# Patient Record
Sex: Male | Born: 1960 | Race: White | Hispanic: Refuse to answer | Smoking: Never smoker
Health system: Southern US, Community
[De-identification: ages and names within clinical notes are randomized; demographics above are authoritative.]

## PROBLEM LIST (undated history)

## (undated) DIAGNOSIS — I10 Essential (primary) hypertension: Secondary | ICD-10-CM

## (undated) DIAGNOSIS — E785 Hyperlipidemia, unspecified: Secondary | ICD-10-CM

---

## 2010-09-28 ENCOUNTER — Emergency Department (HOSPITAL_BASED_OUTPATIENT_CLINIC_OR_DEPARTMENT_OTHER): Admission: EM | Admit: 2010-09-28 | Discharge: 2010-09-28 | Payer: Self-pay | Admitting: Emergency Medicine

## 2010-09-28 ENCOUNTER — Ambulatory Visit: Payer: Self-pay | Admitting: Diagnostic Radiology

## 2011-01-28 LAB — BASIC METABOLIC PANEL
BUN: 19 mg/dL (ref 6–23)
Calcium: 9.7 mg/dL (ref 8.4–10.5)
Chloride: 106 mEq/L (ref 96–112)
Creatinine, Ser: 0.9 mg/dL (ref 0.4–1.5)
GFR calc Af Amer: >60 mL/min (ref >60)

## 2011-01-28 LAB — URINE CULTURE
Colony Count: NO GROWTH
Culture  Setup Time: 201111122149
Culture: NO GROWTH

## 2011-01-28 LAB — URINALYSIS, ROUTINE W REFLEX MICROSCOPIC
Ketones, ur: NEGATIVE mg/dL
Leukocytes, UA: NEGATIVE
Nitrite: NEGATIVE
Protein, ur: NEGATIVE mg/dL
Urobilinogen, UA: 0.2 mg/dL (ref 0.0–1.0)

## 2011-01-28 LAB — URINE MICROSCOPIC-ADD ON

## 2011-10-24 IMAGING — CT CT ABD-PELV W/O CM
2 of 4 series · 16 of 46 positions shown, 18 images · non-contrast
Comparison: None.

CLINICAL DATA: Right flank pain radiates to the right abdomen.

CT ABDOMEN AND PELVIS WITHOUT CONTRAST
TECHNIQUE: Multidetector CT imaging of the abdomen and pelvis was
performed following the standard protocol without intravenous
contrast.

[Series 2: renal stone > 200 lbs 5.0 b31f · axial · 0.83mm/px · z∈[+739,+1204]mm · 13 of 103 slices shown, 15 images]
[im 5/103  soft-tissue]
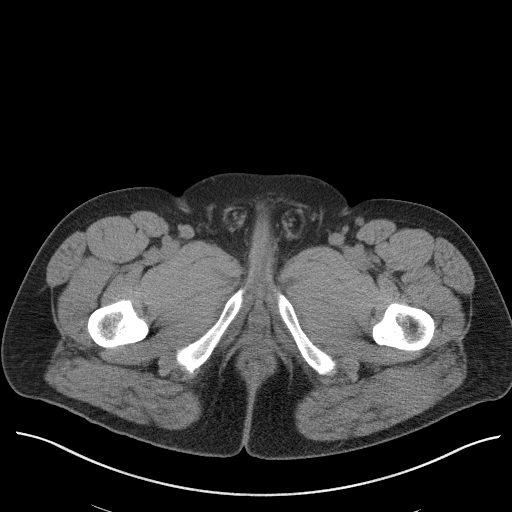
[im 5/103  bone]
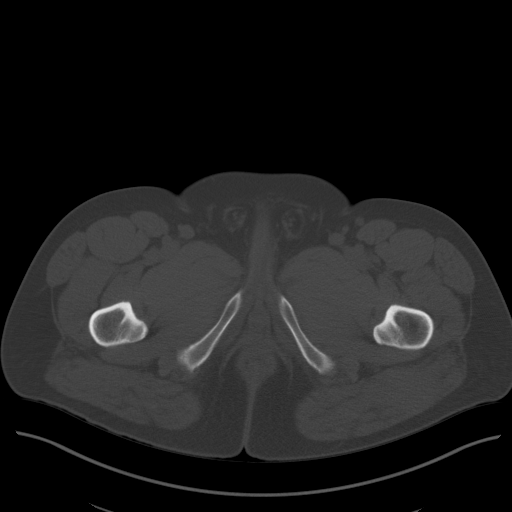
[im 13/103  soft-tissue]
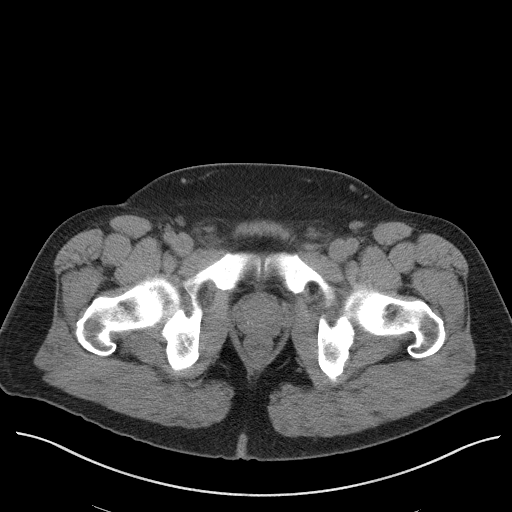
[im 22/103  soft-tissue]
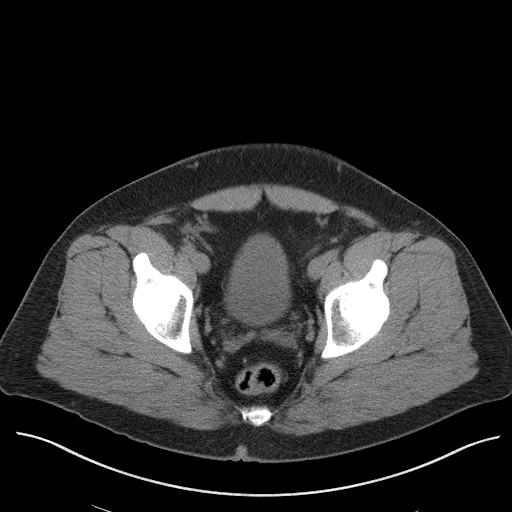
[im 30/103  soft-tissue]
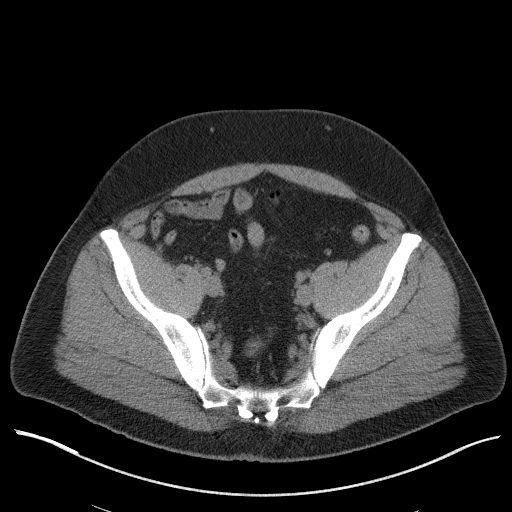
[im 35/103  soft-tissue]
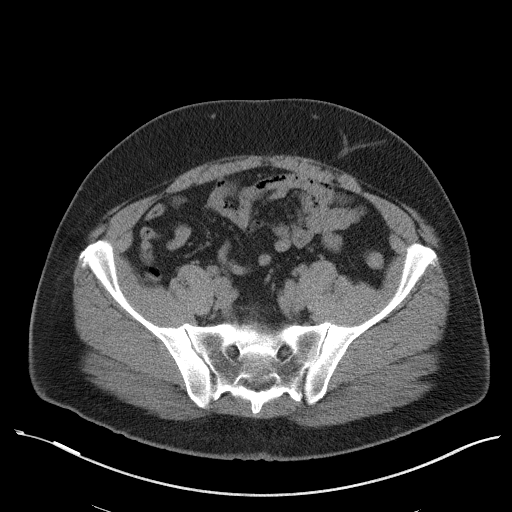
[im 43/103  soft-tissue]
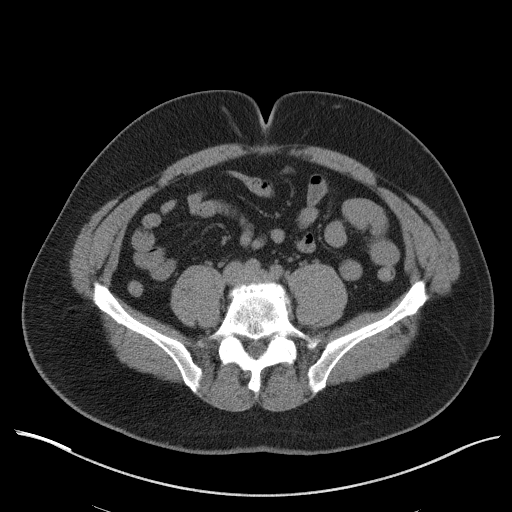
[im 52/103  soft-tissue]
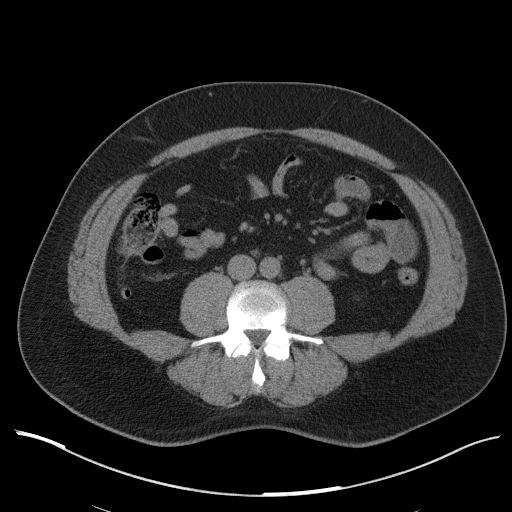
[im 60/103  soft-tissue]
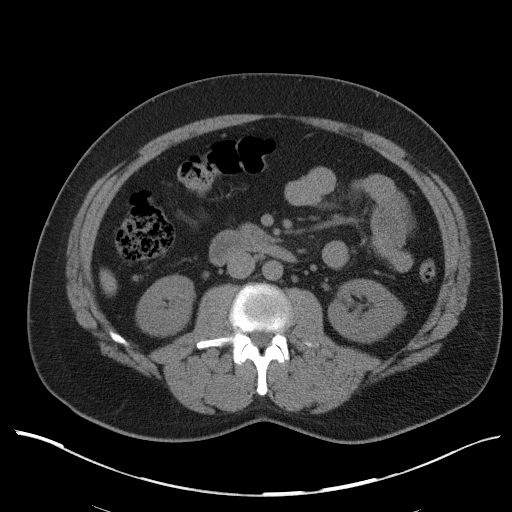
[im 69/103  soft-tissue]
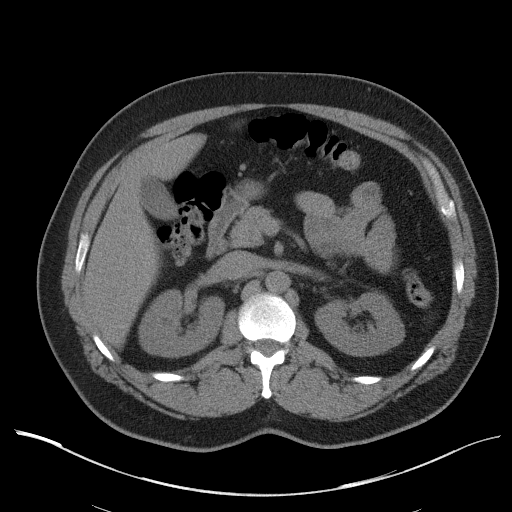
[im 69/103  bone]
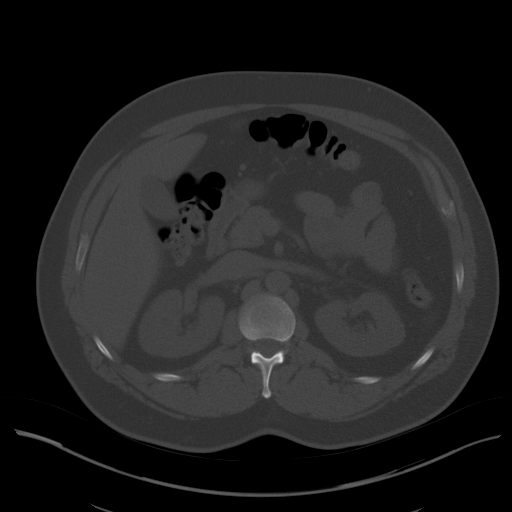
[im 73/103  soft-tissue]
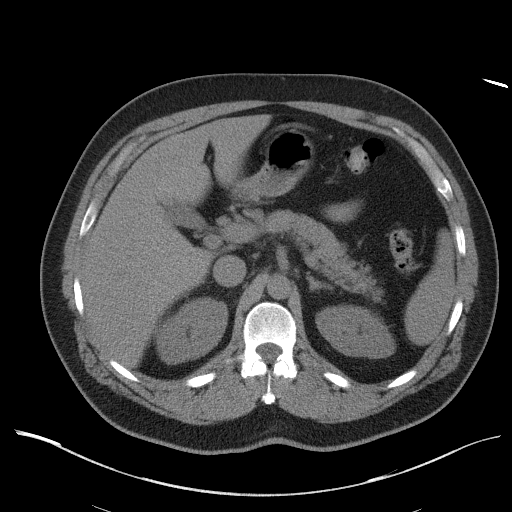
[im 81/103  soft-tissue]
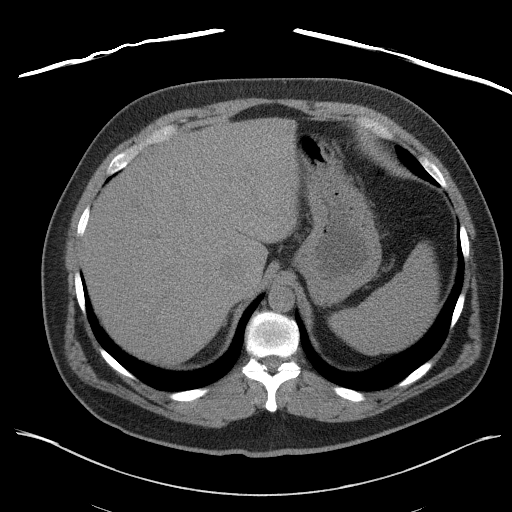
[im 90/103  soft-tissue]
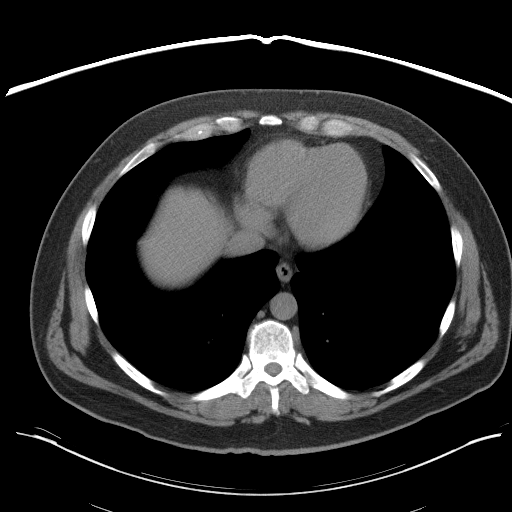
[im 98/103  soft-tissue]
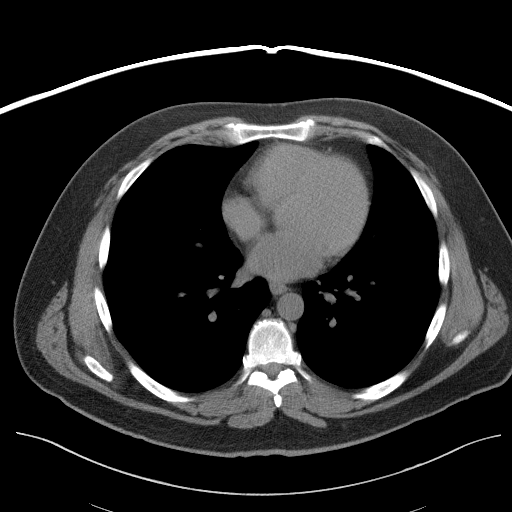

[Series 5: renal stone 3.0 coronal · coronal · 0.96mm/px · 3 of 88 slices shown]
[im 30/88  soft-tissue]
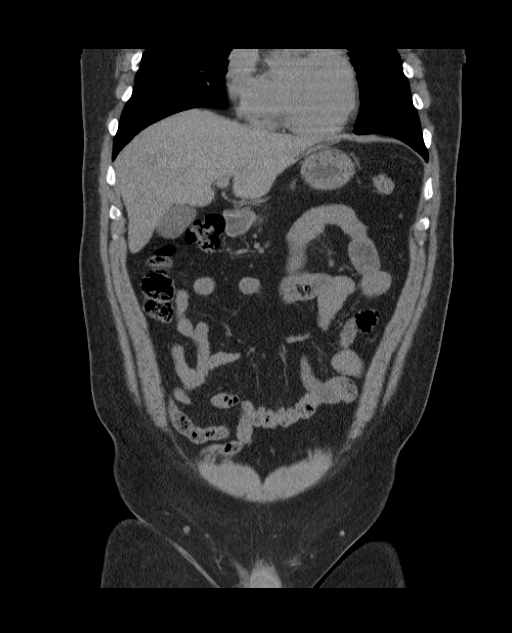
[im 39/88  soft-tissue]
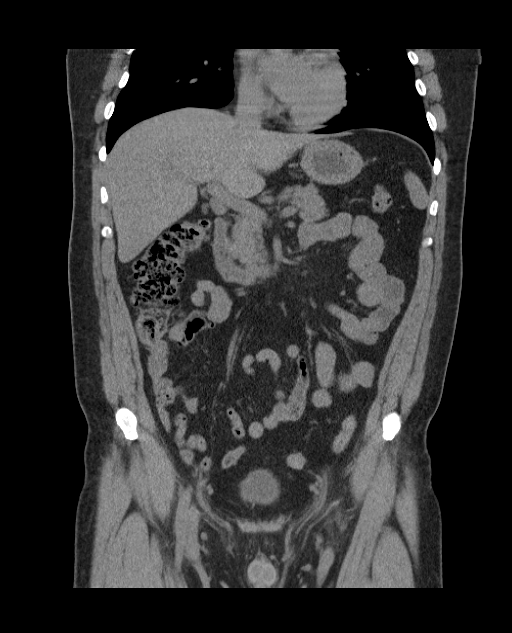
[im 49/88  soft-tissue]
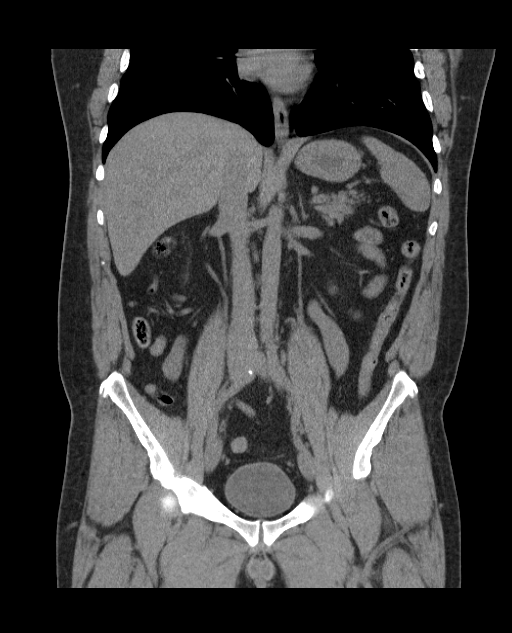

[16 of 46 positions shown; findings below may reference images not displayed]

FINDINGS: The liver and spleen have normal uninfused features.  The
stomach, duodenum, pancreas, gallbladder, and adrenal glands have
normal imaging features.

The kidneys are normal bilaterally without evidence for renal
stones, hydronephrosis, or renal mass.

No free fluid in the abdomen.  There are some borderline enlarged
lymph nodes in the hepatoduodenal ligament but no overt
lymphadenopathy is identified.

Imaging through the pelvis shows no intraperitoneal free fluid.
There is no pelvic lymphadenopathy.  Bladder is unremarkable.
Prostate gland shows mild enlargement.  No evidence for ureteral or
bladder calculi.  No colonic diverticulitis.  The terminal ileum is
normal.  The appendix is normal.

Bone windows reveal no worrisome lytic or sclerotic osseous
lesions.
IMPRESSION: No acute findings in the abdomen or pelvis.  The patient does have
some borderline hepatoduodenal ligament lymphadenopathy of
indeterminate significance/etiology.  This may be a chronic
finding, but additional follow-up CT scan in 3 months is
recommended to ensure that there is no progression.

## 2022-09-23 ENCOUNTER — Other Ambulatory Visit: Payer: Self-pay

## 2022-09-23 ENCOUNTER — Emergency Department (HOSPITAL_BASED_OUTPATIENT_CLINIC_OR_DEPARTMENT_OTHER): Payer: Self-pay

## 2022-09-23 ENCOUNTER — Encounter (HOSPITAL_BASED_OUTPATIENT_CLINIC_OR_DEPARTMENT_OTHER): Payer: Self-pay | Admitting: Pediatrics

## 2022-09-23 ENCOUNTER — Emergency Department (HOSPITAL_BASED_OUTPATIENT_CLINIC_OR_DEPARTMENT_OTHER)
Admission: EM | Admit: 2022-09-23 | Discharge: 2022-09-23 | Disposition: A | Discharge status: Home / Self Care | Payer: No Typology Code available for payment source | Attending: Emergency Medicine | Admitting: Emergency Medicine

## 2022-09-23 DIAGNOSIS — S61213A Laceration without foreign body of left middle finger without damage to nail, initial encounter: Secondary | ICD-10-CM | POA: Insufficient documentation

## 2022-09-23 DIAGNOSIS — W268XXA Contact with other sharp object(s), not elsewhere classified, initial encounter: Secondary | ICD-10-CM | POA: Insufficient documentation

## 2022-09-23 DIAGNOSIS — Z23 Encounter for immunization: Secondary | ICD-10-CM | POA: Insufficient documentation

## 2022-09-23 DIAGNOSIS — S6992XA Unspecified injury of left wrist, hand and finger(s), initial encounter: Secondary | ICD-10-CM | POA: Diagnosis present

## 2022-09-23 DIAGNOSIS — Y990 Civilian activity done for income or pay: Secondary | ICD-10-CM | POA: Diagnosis not present

## 2022-09-23 HISTORY — DX: Essential (primary) hypertension: I10

## 2022-09-23 HISTORY — DX: Hyperlipidemia, unspecified: E78.5

## 2022-09-23 MED ORDER — TETANUS-DIPHTH-ACELL PERTUSSIS 5-2.5-18.5 LF-MCG/0.5 IM SUSY
0.5000 mL | PREFILLED_SYRINGE | Freq: Once | INTRAMUSCULAR | Status: AC
  Administered 2022-09-23: 0.5 mL via INTRAMUSCULAR
  Filled 2022-09-23: qty 0.5

## 2022-09-23 MED ORDER — LIDOCAINE HCL (PF) 1 % IJ SOLN
10.0000 mL | Freq: Once | INTRAMUSCULAR | Status: AC
  Administered 2022-09-23: 10 mL
  Filled 2022-09-23: qty 10

## 2022-09-23 NOTE — Discharge Instructions (Addendum)
It was a pleasure taking care of you today!   You may return to urgent care or return to the emergency department for suture removal in 7-10 days.  Keep the area clean and dry. Attached is more information regarding laceration care. Return to the emergency department if worsening or persistent pain, drainage of wound, increased swelling, or color change to area.

## 2022-09-23 NOTE — ED Triage Notes (Signed)
Reported injured left middle finger on a metal plate while at work; bandage in place; bleeding controlled.

## 2022-09-23 NOTE — ED Provider Notes (Signed)
[facility] EMERGENCY DEPARTMENT Provider Note   CSN: [id number] Arrival date & time: 09/23/22  1143     History  Chief Complaint  Patient presents with   Finger Injury    Jordan Frazier is a 61 y.o. male who presents emergency department with concerns for left middle finger injury onset prior to arrival.  He notes that his finger was cut by metal plate while at work.  No anticoagulant use at this time.  His tetanus has not been within the past 5 years.  Denies swelling.  No known drug allergies.  The history is provided by the patient. No language interpreter was used.       Home Medications Prior to Admission medications   Not on File      Allergies    Patient has no known allergies.    Review of Systems   Review of Systems  All other systems reviewed and are negative.   Physical Exam Updated Vital Signs BP (!) 166/97 (BP Location: Right Arm)   Pulse 74   Temp 98 F (36.7 C) (Oral)   Resp 18   Ht 5' 10.5" (1.791 m)   Wt 103.4 kg   SpO2 94%   BMI 32.25 kg/m  Physical Exam Vitals and nursing note reviewed.  Constitutional:      General: He is not in acute distress.    Appearance: Normal appearance. He is not ill-appearing.  HENT:     Head: Normocephalic and atraumatic.     Right Ear: External ear normal.     Left Ear: External ear normal.  Eyes:     General: No scleral icterus. Cardiovascular:     Rate and Rhythm: Normal rate.  Pulmonary:     Effort: Pulmonary effort is normal.  Musculoskeletal:        General: Normal range of motion.     Cervical back: Normal range of motion and neck supple.     Comments: Normal flexion and extension against resistance. NVI. Capillary refill intact. Nailbed intact.   Skin:    General: Skin is warm and dry.     Capillary Refill: Capillary refill takes less than 2 seconds.     Findings: Laceration present.     Comments: 2 cm laceration noted to distal phalanx of left middle finger.  Neurological:     Mental  Status: He is alert.     ED Results / Procedures / Treatments   Labs (all labs ordered are listed, but only abnormal results are displayed) Labs Reviewed - No data to display  EKG None  Radiology DG Hand Complete Left  Result Date: 09/23/2022 CLINICAL DATA:  Trauma EXAM: LEFT HAND - COMPLETE 3 VIEW COMPARISON:  None Available. FINDINGS: No evidence of fracture or dislocation. Significant mild degenerative changes of the thumb joint IP joint in the distal radioulnar joint. Soft tissue swelling and irregularity of the distal middle phalanx with overlying bandage. IMPRESSION: No acute fracture or dislocation. Electronically Signed   By: Allegra Lai M.D.   On: 09/23/2022 12:40    Procedures .Marland KitchenLaceration Repair  Date/Time: 09/23/2022 5:34 PM  Performed by: Karenann Cai, PA-C Authorized by: Karenann Cai, PA-C   Consent:    Consent obtained:  Verbal   Consent given by:  Patient   Risks discussed:  Infection, pain and need for additional repair Universal protocol:    Patient identity confirmed:  Verbally with patient and hospital-assigned identification number Anesthesia:    Anesthesia method:  Local infiltration  Local anesthetic:  Lidocaine 1% w/o epi Laceration details:    Location:  Finger   Finger location:  L long finger   Length (cm):  2.5 Pre-procedure details:    Preparation:  Patient was prepped and draped in usual sterile fashion Exploration:    Hemostasis achieved with:  Direct pressure   Imaging outcome: foreign body not noted     Wound exploration: entire depth of wound visualized   Treatment:    Area cleansed with:  Saline   Amount of cleaning:  Standard   Irrigation solution:  Sterile saline   Irrigation method:  Syringe Skin repair:    Repair method:  Sutures   Suture size:  5-0   Suture material:  Prolene   Suture technique:  Simple interrupted   Number of sutures:  8 Approximation:    Approximation:  Close Repair type:    Repair type:   Simple Post-procedure details:    Dressing:  Non-adherent dressing   Procedure completion:  Tolerated well, no immediate complications     Medications Ordered in ED Medications  lidocaine (PF) (XYLOCAINE) 1 % injection 10 mL (10 mLs Infiltration Given by Other 09/23/22 1539)  Tdap (BOOSTRIX) injection 0.5 mL (0.5 mLs Intramuscular Given 09/23/22 1541)    ED Course/ Medical Decision Making/ A&P                           Medical Decision Making Amount and/or Complexity of Data Reviewed Radiology: ordered.  Risk Prescription drug management.   Patient presents with laceration noted to left middle finger onset prior to arrival. Pt is not on anticoagulants at this time. Vital signs, patient afebrile. On exam, patient with Normal flexion and extension against resistance. NVI. Capillary refill intact. Nailbed intact. 2 cm laceration noted to distal phalanx of left middle finger. Tetanus updated in the ED. Laceration occurred < 12 hours prior to repair. Differential diagnosis includes, fracture, foreign body, dislocation, avulsion.   Imaging: I ordered imaging studies including left hand x-ray I independently visualized and interpreted imaging which showed: No acute fracture or dislocation I agree with the radiologist interpretation    Disposition: Presenting suspicious for laceration. Doubt fracture, dislocation, or foreign body at this time. Tetanus updated in the ED. Wound thoroughly irrigated, no foreign bodies noted. Laceration repaired in the ED today. After consideration of the diagnostic results and the patients response to treatment, I feel that the patient would benefit from Discharge home. Discussed laceration care with pt and answered questions. Pt to follow up for suture/staple removal in 7-10 days and wound check sooner should there be signs of dehiscence or infection. Pt is hemodynamically stable with no complaints prior to discharge. Supportive care measures and strict return  precautions discussed with patient at bedside. Pt acknowledges and verbalizes understanding. Pt appears safe for discharge. Follow up as indicated in discharge paperwork.    This chart was dictated using voice recognition software, Dragon. Despite the best efforts of this provider to proofread and correct errors, errors may still occur which can change documentation meaning.   Final Clinical Impression(s) / ED Diagnoses Final diagnoses:  Laceration of left middle finger without foreign body without damage to nail, initial encounter    Rx / DC Orders ED Discharge Orders     None         Latania Bascomb A, PA-C 09/23/22 [place], Dan, DO 09/23/22 1810

## 2022-09-23 NOTE — ED Notes (Signed)
Sterile dressing applied

## 2022-10-07 ENCOUNTER — Emergency Department (HOSPITAL_BASED_OUTPATIENT_CLINIC_OR_DEPARTMENT_OTHER)
Admission: EM | Admit: 2022-10-07 | Discharge: 2022-10-07 | Disposition: A | Discharge status: Home / Self Care | Payer: No Typology Code available for payment source | Attending: Emergency Medicine | Admitting: Emergency Medicine

## 2022-10-07 ENCOUNTER — Encounter (HOSPITAL_BASED_OUTPATIENT_CLINIC_OR_DEPARTMENT_OTHER): Payer: Self-pay

## 2022-10-07 DIAGNOSIS — Z4801 Encounter for change or removal of surgical wound dressing: Secondary | ICD-10-CM | POA: Diagnosis present

## 2022-10-07 DIAGNOSIS — Z4802 Encounter for removal of sutures: Secondary | ICD-10-CM

## 2022-10-07 NOTE — Discharge Instructions (Signed)
You were seen in the emergency department for your suture removal.  All stitches were removed from your finger and your wound appears to be healing well.  There are no signs of infection today.  You can follow-up with your primary doctor as needed.  You should return to the emergency department if you are having increasing pain and swelling around your finger, spreading redness, pus draining from the wound or if you have any other new or concerning symptoms.

## 2022-10-07 NOTE — ED Triage Notes (Signed)
Here for suture removal, 8 sutures to left middle finger, placed on 11/7.

## 2022-10-07 NOTE — ED Provider Notes (Signed)
[facility] Provider Note   CSN: [id number] Arrival date & time: 10/07/22  [id number]     History  Chief Complaint  Patient presents with   Suture / Staple Removal    Jordan Frazier is a 61 y.o. male.  Is a 61 year old male presenting to the emergency department for suture removal.  Patient states that he cut his finger 2 weeks ago and was seen in the emergency department here and had 8 stitches placed.  He states that he was told to have the stitches removed in 7 to 10 days and was seen at an urgent care in [facility] over the weekend but they recommended he wait 3 more days before suture removal.  He states that he is having a little bit of tenderness around the tip of his finger but denies any redness or drainage, fevers or chills.  He states he took Advil for pain this morning.  The history is provided by the patient.  Suture / Staple Removal       Home Medications Prior to Admission medications   Not on File      Allergies    Niacin    Review of Systems   Review of Systems  Physical Exam Updated Vital Signs BP (!) 172/91   Pulse 62   Temp 97.7 F (36.5 C) (Oral)   Resp 18   Ht 5' 10.5" (1.791 m)   Wt 104.3 kg   SpO2 99%   BMI 32.54 kg/m  Physical Exam Vitals and nursing note reviewed.  Constitutional:      General: He is not in acute distress.    Appearance: Normal appearance. He is obese.  HENT:     Head: Normocephalic and atraumatic.     Nose: Nose normal.     Mouth/Throat:     Mouth: Mucous membranes are moist.     Pharynx: Oropharynx is clear.  Eyes:     Extraocular Movements: Extraocular movements intact.  Cardiovascular:     Pulses: Normal pulses.  Pulmonary:     Effort: Pulmonary effort is normal.  Musculoskeletal:     Cervical back: Normal range of motion.     Comments: Full flexion/extension of left middle finger DIP, PIP and MCP joints, mild tenderness to palpation of distal tip of left middle finger  Skin:     General: Skin is warm and dry.     Comments: Well-healing laceration to tip of left middle finger with sutures in place, no surrounding erythema, warmth or drainage  Neurological:     General: No focal deficit present.     Mental Status: He is alert and oriented to person, place, and time.     Sensory: No sensory deficit.     Motor: No weakness.  Psychiatric:        Mood and Affect: Mood normal.        Behavior: Behavior normal.     ED Results / Procedures / Treatments   Labs (all labs ordered are listed, but only abnormal results are displayed) Labs Reviewed - No data to display  EKG None  Radiology No results found.  Procedures .Suture Removal  Date/Time: 10/07/2022 7:51 AM  Performed by: Kemper Durie, DO Authorized by: Kemper Durie, DO   Consent:    Consent obtained:  Verbal   Consent given by:  Patient   Risks, benefits, and alternatives were discussed: yes     Risks discussed:  Bleeding, pain and wound separation  Alternatives discussed:  No treatment and delayed treatment Universal protocol:    Procedure explained and questions answered to patient or proxy's satisfaction: yes     Patient identity confirmed:  Verbally with patient Location:    Location:  Upper extremity   Upper extremity location:  Hand   Hand location:  L long finger Procedure details:    Wound appearance:  No signs of infection, tender, good wound healing and clean   Number of sutures removed:  8 Post-procedure details:    Post-removal:  No dressing applied   Procedure completion:  Tolerated well, no immediate complications     Medications Ordered in ED Medications - No data to display  ED Course/ Medical Decision Making/ A&P                           Medical Decision Making This patient presents to the ED with chief complaint(s) of suture removal with pertinent past medical history of pretension, hyperlipidemia which further complicates the presenting complaint.  The complaint involves an extensive differential diagnosis and also carries with it a high risk of complications and morbidity.    The differential diagnosis includes well-healed wound, no signs of infection  Additional history obtained: Additional history obtained from N/A Records reviewed previous ED records  ED Course and Reassessment: Upon patient's arrival to the emergency department he was awake alert and well-appearing.  He has mild tenderness around the tip of his left middle finger but no erythema, warmth or drainage making infection unlikely.  All sutures were removed without any wound dehiscence.  Patient stable for discharge home with follow-up as needed.  Independent labs interpretation:  N/A  Independent visualization of imaging: N/A  Consultation: - Consulted or discussed management/test interpretation w/ external professional: N/A  Consideration for admission or further workup: Patient has no emergent conditions requiring admission or further work-up at this time and is stable for discharge home with primary care follow-up as needed Social Determinants of health: N/A            Final Clinical Impression(s) / ED Diagnoses Final diagnoses:  Visit for suture removal    Rx / DC Orders ED Discharge Orders     None         Rexford Maus, DO 10/07/22 [phone]

## 2022-12-13 ENCOUNTER — Emergency Department (HOSPITAL_BASED_OUTPATIENT_CLINIC_OR_DEPARTMENT_OTHER): Payer: BC Managed Care – PPO

## 2022-12-13 ENCOUNTER — Encounter (HOSPITAL_BASED_OUTPATIENT_CLINIC_OR_DEPARTMENT_OTHER): Payer: Self-pay | Admitting: Emergency Medicine

## 2022-12-13 ENCOUNTER — Emergency Department (HOSPITAL_BASED_OUTPATIENT_CLINIC_OR_DEPARTMENT_OTHER)
Admission: EM | Admit: 2022-12-13 | Discharge: 2022-12-13 | Disposition: A | Discharge status: Home / Self Care | Payer: BC Managed Care – PPO | Attending: Emergency Medicine | Admitting: Emergency Medicine

## 2022-12-13 ENCOUNTER — Other Ambulatory Visit: Payer: Self-pay

## 2022-12-13 DIAGNOSIS — S6992XA Unspecified injury of left wrist, hand and finger(s), initial encounter: Secondary | ICD-10-CM | POA: Diagnosis present

## 2022-12-13 DIAGNOSIS — S63637A Sprain of interphalangeal joint of left little finger, initial encounter: Secondary | ICD-10-CM | POA: Insufficient documentation

## 2022-12-13 DIAGNOSIS — M79645 Pain in left finger(s): Secondary | ICD-10-CM | POA: Insufficient documentation

## 2022-12-13 DIAGNOSIS — W108XXA Fall (on) (from) other stairs and steps, initial encounter: Secondary | ICD-10-CM | POA: Diagnosis not present

## 2022-12-13 DIAGNOSIS — S6991XA Unspecified injury of right wrist, hand and finger(s), initial encounter: Secondary | ICD-10-CM

## 2022-12-13 NOTE — Discharge Instructions (Addendum)
You have been seen today for your complaint of right thumb and left fifth finger pain. Your imaging showed that she may have an extensor tendon injury. Your discharge medications include Alternate tylenol and ibuprofen for pain. You may alternate these every 4 hours. You may take up to 800 mg of ibuprofen at a time and up to 1000 mg of tylenol. Home care instructions are as follows:  Keep the splint on at all times until you are able to follow-up with hand surgery Follow up with: Dr. Fredna Dow.  He is a Copy.  He should call the number listed in this packet to schedule an appointment for as soon as possible. Please seek immediate medical care if you develop any of the following symptoms: Your finger is numb or blue. Your finger feels colder to the touch than normal. At this time there does not appear to be the presence of an emergent medical condition, however there is always the potential for conditions to change. Please read and follow the below instructions.  Do not take your medicine if  develop an itchy rash, swelling in your mouth or lips, or difficulty breathing; call 911 and seek immediate emergency medical attention if this occurs.  You may review your lab tests and imaging results in their entirety on your MyChart account.  Please discuss all results of fully with your primary care provider and other specialist at your follow-up visit.  Note: Portions of this text may have been transcribed using voice recognition software. Every effort was made to ensure accuracy; however, inadvertent computerized transcription errors may still be present.

## 2022-12-13 NOTE — ED Provider Notes (Signed)
[facility] Provider Note   CSN: [id number] Arrival date & time: 12/13/22  0841     History  Chief Complaint  Patient presents with   Finger Injury    Jordan Frazier is a 62 y.o. male.  Who presents to the ED for evaluation of a fall.  He states that yesterday afternoon he was walking up his stairs at home carrying a case of bottles of water.  He tripped over one of the stairs and fell up the stairs.  He tried to catch himself with outstretched hands.  Does not remember the exact details of the fall because it happened too fast.  He did not hit his head or lose consciousness.  Does not take blood thinners.  Is complaining of right thumb pain with flexion and left fifth finger pain with flexion.  He has no pain at rest.  He does not have any wrist pain.  He states he was able to sleep through the night without difficulty.  He has not iced or taking any pain medications at home.  He has been unable to fully extend the right thumb since the injury.  HPI     Home Medications Prior to Admission medications   Not on File      Allergies    Niacin    Review of Systems   Review of Systems  Musculoskeletal:  Positive for arthralgias.  All other systems reviewed and are negative.   Physical Exam Updated Vital Signs BP (!) 127/52 (BP Location: Right Arm)   Pulse 68   Temp 98.2 F (36.8 C)   Resp 17   SpO2 98%  Physical Exam Vitals and nursing note reviewed.  Constitutional:      General: He is not in acute distress.    Appearance: Normal appearance. He is well-developed. He is not ill-appearing, toxic-appearing or diaphoretic.  HENT:     Head: Normocephalic and atraumatic.  Eyes:     Conjunctiva/sclera: Conjunctivae normal.  Cardiovascular:     Rate and Rhythm: Normal rate and regular rhythm.     Heart sounds: No murmur heard. Pulmonary:     Effort: Pulmonary effort is normal. No respiratory distress.     Breath sounds: Normal  breath sounds.  Abdominal:     Palpations: Abdomen is soft.     Tenderness: There is no abdominal tenderness.  Musculoskeletal:        General: Swelling (R thumb swelling at DIP, L 5th finger swelling at DIP) and deformity (R thumb held in slight flexion) present. No tenderness (No TTP. specifically no TTP to bilateral wrists, R thumb, bilateral anatomical snuffbox, L 5th finger).     Cervical back: Neck supple.     Comments: Unable to extend R thumb at DIP joint  Skin:    General: Skin is warm and dry.     Capillary Refill: Capillary refill takes less than 2 seconds.  Neurological:     General: No focal deficit present.     Mental Status: He is alert and oriented to person, place, and time.     Comments: Sensation intact.  Flexion of the MCP and DIP of the right thumb intact.  Flexion and extension of all other fingers intact.  Grip strength 5 out of 5 in bilateral hands.  Cap refill normal.  Psychiatric:        Mood and Affect: Mood normal.     ED Results / Procedures / Treatments   Labs (  all labs ordered are listed, but only abnormal results are displayed) Labs Reviewed - No data to display  EKG None  Radiology DG Finger Little Left  Result Date: 12/13/2022 CLINICAL DATA:  Fall, left small finger pain EXAM: LEFT LITTLE FINGER 2+V COMPARISON:  09/23/2022 FINDINGS: Tiny mineralized density along the distal aspect of the small finger middle phalanx adjacent to the DIP joint, possibly a tiny avulsion fragment. This is located along the ulnar aspect of the joint. A similar tiny density seen along the radial aspect of the joint, however this was present on the previous radiographs. Mild osteoarthritic changes. No additional signs of fracture. No dislocation. Soft tissues within normal limits. IMPRESSION: Tiny mineralized density along the distal aspect of the small finger middle phalanx adjacent to the DIP joint, possibly a tiny avulsion fragment. Correlate with point tenderness.  Electronically Signed   By: Duanne Guess D.O.   On: 12/13/2022 09:21   DG Finger Thumb Right  Result Date: 12/13/2022 CLINICAL DATA:  Larey Seat on outstretched hand. Right thumb injury and pain. EXAM: RIGHT THUMB 2+V COMPARISON:  None Available. FINDINGS: There is no evidence of fracture or dislocation. There is no evidence of arthropathy or other focal bone abnormality. Partial flexion noted at the interphalangeal joint, which could indicate injury to extensor tendon mechanism. IMPRESSION: No evidence of fracture. Partial flexion at the interphalangeal joint, which could indicate injury to extensor tendon mechanism. Recommend clinical correlation. Electronically Signed   By: Danae Orleans M.D.   On: 12/13/2022 09:12    Procedures Procedures    Medications Ordered in ED Medications - No data to display  ED Course/ Medical Decision Making/ A&P                             Medical Decision Making Amount and/or Complexity of Data Reviewed Radiology: ordered.  This patient presents to the ED for concern of fall, right thumb, left fifth finger pain, this involves an extensive number of treatment options, and is a complaint that carries with it a high risk of complications and morbidity.  The differential diagnosis includes fracture, dislocation, strain, sprain, tendon injury  My initial workup includes x-ray right thumb and left fifth finger  Additional history obtained from: Nursing notes from this visit.  I ordered imaging studies including x-ray right thumb and left ring finger I independently visualized and interpreted imaging which showed right thumb in slight flexion indicating possible extensor tendon injury, left fifth finger with tiny mineralized densities suspicious for possible avulsion fracture I agree with the radiologist interpretation  Afebrile, hemodynamically stable.  62 year old male presents ED for evaluation of right thumb and left fifth finger pain after a fall that  occurred yesterday afternoon.  He only has pain with flexion of either digits.  He has no pain at rest.  He has no tenderness to palpation.  He has some mild swelling to the DIP of the right thumb and the left fifth digit.  He has no wrist pain or anatomical snuffbox tenderness bilaterally.  X-ray shows no definitive fracture.  He is unable to fully extend his right thumb.  Concern for extensor tendon injury. Neurovascular status is intact. Patient will be splinted in extension in the right thumb and the left fifth finger and encouraged to follow-up with hand surgery.  He was given contact information for hand surgery and encouraged to call to schedule an appointment for soon as possible.  He was encouraged to  keep the splint on at all times until he is able to follow-up.  He was given return precautions.  Stable at discharge.  At this time there does not appear to be any evidence of an acute emergency medical condition and the patient appears stable for discharge with appropriate outpatient follow up. Diagnosis was discussed with patient who verbalizes understanding of care plan and is agreeable to discharge. I have discussed return precautions with patient who verbalizes understanding. Patient encouraged to follow-up with hand surgery as soon as possible. All questions answered.  Patient's case discussed with Dr. Nechama Guard who agrees with plan to discharge with follow-up.   Note: Portions of this report may have been transcribed using voice recognition software. Every effort was made to ensure accuracy; however, inadvertent computerized transcription errors may still be present.         Final Clinical Impression(s) / ED Diagnoses Final diagnoses:  Thumb injury, right, initial encounter  Sprain of interphalangeal joint of left little finger, initial encounter    Rx / DC Orders ED Discharge Orders     None         Roylene Reason, Hershal Coria 12/13/22 [place], Media,  MD 12/14/22 [phone]

## 2022-12-13 NOTE — ED Triage Notes (Signed)
Pt reports he tripped over feet this am and landed on outstretched hands. Pt complains of R thumb and L pinky finger pain. Pain with movement.

## 2023-06-04 ENCOUNTER — Emergency Department (HOSPITAL_COMMUNITY): Payer: BC Managed Care – PPO

## 2023-06-04 ENCOUNTER — Inpatient Hospital Stay (HOSPITAL_COMMUNITY)
Admission: EM | Admit: 2023-06-04 | Discharge: 2023-06-07 | DRG: 872 | Disposition: A | Discharge status: Home / Self Care | Payer: BC Managed Care – PPO | Attending: Family Medicine | Admitting: Family Medicine

## 2023-06-04 ENCOUNTER — Other Ambulatory Visit: Payer: Self-pay

## 2023-06-04 ENCOUNTER — Encounter (HOSPITAL_COMMUNITY): Payer: Self-pay | Admitting: Internal Medicine

## 2023-06-04 DIAGNOSIS — I10 Essential (primary) hypertension: Secondary | ICD-10-CM | POA: Diagnosis present

## 2023-06-04 DIAGNOSIS — E119 Type 2 diabetes mellitus without complications: Secondary | ICD-10-CM | POA: Diagnosis present

## 2023-06-04 DIAGNOSIS — N410 Acute prostatitis: Secondary | ICD-10-CM | POA: Diagnosis present

## 2023-06-04 DIAGNOSIS — A4153 Sepsis due to Serratia: Secondary | ICD-10-CM | POA: Diagnosis present

## 2023-06-04 DIAGNOSIS — Z79899 Other long term (current) drug therapy: Secondary | ICD-10-CM

## 2023-06-04 DIAGNOSIS — N390 Urinary tract infection, site not specified: Secondary | ICD-10-CM | POA: Diagnosis present

## 2023-06-04 DIAGNOSIS — R6520 Severe sepsis without septic shock: Secondary | ICD-10-CM | POA: Diagnosis present

## 2023-06-04 DIAGNOSIS — E785 Hyperlipidemia, unspecified: Secondary | ICD-10-CM | POA: Diagnosis present

## 2023-06-04 DIAGNOSIS — B9689 Other specified bacterial agents as the cause of diseases classified elsewhere: Secondary | ICD-10-CM | POA: Diagnosis present

## 2023-06-04 DIAGNOSIS — E876 Hypokalemia: Secondary | ICD-10-CM | POA: Diagnosis present

## 2023-06-04 DIAGNOSIS — A4152 Sepsis due to Pseudomonas: Principal | ICD-10-CM | POA: Diagnosis present

## 2023-06-04 DIAGNOSIS — A419 Sepsis, unspecified organism: Secondary | ICD-10-CM | POA: Diagnosis present

## 2023-06-04 DIAGNOSIS — N400 Enlarged prostate without lower urinary tract symptoms: Secondary | ICD-10-CM | POA: Diagnosis present

## 2023-06-04 DIAGNOSIS — E872 Acidosis, unspecified: Secondary | ICD-10-CM | POA: Diagnosis present

## 2023-06-04 DIAGNOSIS — E669 Obesity, unspecified: Secondary | ICD-10-CM | POA: Diagnosis present

## 2023-06-04 DIAGNOSIS — Z6832 Body mass index (BMI) 32.0-32.9, adult: Secondary | ICD-10-CM

## 2023-06-04 LAB — HIV ANTIBODY (ROUTINE TESTING W REFLEX): HIV Screen 4th Generation wRfx: NONREACTIVE

## 2023-06-04 LAB — CBC WITH DIFFERENTIAL/PLATELET
Abs Immature Granulocytes: 0.17 10*3/uL — ABNORMAL HIGH (ref 0.00–0.07)
Basophils Absolute: 0 10*3/uL (ref 0.0–0.1)
Basophils Relative: 0 %
Eosinophils Absolute: 0 10*3/uL (ref 0.0–0.5)
Eosinophils Relative: 0 %
HCT: 45.1 % (ref 39.0–52.0)
Hemoglobin: 15.2 g/dL (ref 13.0–17.0)
Immature Granulocytes: 1 %
Lymphocytes Relative: 3 %
Lymphs Abs: 0.6 10*3/uL — ABNORMAL LOW (ref 0.7–4.0)
MCH: 29 pg (ref 26.0–34.0)
MCHC: 33.7 g/dL (ref 30.0–36.0)
MCV: 86.1 fL (ref 80.0–100.0)
Monocytes Absolute: 1.4 10*3/uL — ABNORMAL HIGH (ref 0.1–1.0)
Monocytes Relative: 6 %
Neutro Abs: 19.3 10*3/uL — ABNORMAL HIGH (ref 1.7–7.7)
Neutrophils Relative %: 90 %
Platelets: 228 10*3/uL (ref 150–400)
RBC: 5.24 MIL/uL (ref 4.22–5.81)
RDW: 14.2 % (ref 11.5–15.5)
WBC: 21.5 10*3/uL — ABNORMAL HIGH (ref 4.0–10.5)
nRBC: 0 % (ref 0.0–0.2)

## 2023-06-04 LAB — COMPREHENSIVE METABOLIC PANEL
ALT: 29 U/L (ref 0–44)
AST: 19 U/L (ref 15–41)
Albumin: 3.9 g/dL (ref 3.5–5.0)
Alkaline Phosphatase: 52 U/L (ref 38–126)
Anion gap: 9 (ref 5–15)
BUN: 19 mg/dL (ref 8–23)
CO2: 23 mmol/L (ref 22–32)
Calcium: 8.8 mg/dL — ABNORMAL LOW (ref 8.9–10.3)
Chloride: 102 mmol/L (ref 98–111)
Creatinine, Ser: 0.97 mg/dL (ref 0.61–1.24)
GFR, Estimated: >60 mL/min (ref >60)
Glucose, Bld: 145 mg/dL — ABNORMAL HIGH (ref 70–99)
Potassium: 3 mmol/L — ABNORMAL LOW (ref 3.5–5.1)
Sodium: 134 mmol/L — ABNORMAL LOW (ref 135–145)
Total Bilirubin: 1.7 mg/dL — ABNORMAL HIGH (ref 0.3–1.2)
Total Protein: 7.3 g/dL (ref 6.5–8.1)

## 2023-06-04 LAB — LIPASE, BLOOD: Lipase: 25 U/L (ref 11–51)

## 2023-06-04 LAB — LACTIC ACID, PLASMA
Lactic Acid, Venous: 1.9 mmol/L (ref 0.5–1.9)
Lactic Acid, Venous: 2.4 mmol/L (ref 0.5–1.9)

## 2023-06-04 LAB — URINALYSIS, W/ REFLEX TO CULTURE (INFECTION SUSPECTED)
Bilirubin Urine: NEGATIVE
Glucose, UA: NEGATIVE mg/dL
Ketones, ur: NEGATIVE mg/dL
Nitrite: NEGATIVE
Protein, ur: NEGATIVE mg/dL
Specific Gravity, Urine: <=1.005 (ref 1.005–1.030)
WBC, UA: >50 WBC/hpf (ref 0–5)
pH: 5 (ref 5.0–8.0)

## 2023-06-04 LAB — CBG MONITORING, ED: Glucose-Capillary: 179 mg/dL — ABNORMAL HIGH (ref 70–99)

## 2023-06-04 LAB — SEDIMENTATION RATE: Sed Rate: 5 mm/hr (ref 0–16)

## 2023-06-04 LAB — MAGNESIUM: Magnesium: 1.6 mg/dL — ABNORMAL LOW (ref 1.7–2.4)

## 2023-06-04 LAB — GLUCOSE, CAPILLARY: Glucose-Capillary: 136 mg/dL — ABNORMAL HIGH (ref 70–99)

## 2023-06-04 LAB — C-REACTIVE PROTEIN: CRP: 7.1 mg/dL — ABNORMAL HIGH (ref <1.0)

## 2023-06-04 MED ORDER — ENOXAPARIN SODIUM 40 MG/0.4ML IJ SOSY
40.0000 mg | PREFILLED_SYRINGE | Freq: Every day | INTRAMUSCULAR | Status: DC
  Administered 2023-06-04 – 2023-06-06 (×3): 40 mg via SUBCUTANEOUS
  Filled 2023-06-04 (×4): qty 0.4

## 2023-06-04 MED ORDER — ONDANSETRON HCL 4 MG/2ML IJ SOLN
4.0000 mg | Freq: Four times a day (QID) | INTRAMUSCULAR | Status: DC | PRN
  Administered 2023-06-04: 4 mg via INTRAVENOUS
  Filled 2023-06-04: qty 2

## 2023-06-04 MED ORDER — ONDANSETRON HCL 4 MG PO TABS: 4.0000 mg | ORAL_TABLET | Freq: Four times a day (QID) | ORAL | Status: DC | PRN

## 2023-06-04 MED ORDER — INSULIN ASPART 100 UNIT/ML IJ SOLN
0.0000 [IU] | Freq: Three times a day (TID) | INTRAMUSCULAR | Status: DC
  Filled 2023-06-04: qty 0.15

## 2023-06-04 MED ORDER — TRAZODONE HCL 50 MG PO TABS: 25.0000 mg | ORAL_TABLET | Freq: Every evening | ORAL | Status: DC | PRN

## 2023-06-04 MED ORDER — MORPHINE SULFATE (PF) 4 MG/ML IV SOLN
4.0000 mg | Freq: Once | INTRAVENOUS | Status: AC
  Administered 2023-06-04: 4 mg via INTRAVENOUS
  Filled 2023-06-04: qty 1

## 2023-06-04 MED ORDER — INSULIN ASPART 100 UNIT/ML IJ SOLN
0.0000 [IU] | Freq: Every day | INTRAMUSCULAR | Status: DC
  Filled 2023-06-04: qty 0.05

## 2023-06-04 MED ORDER — ACETAMINOPHEN 325 MG PO TABS
650.0000 mg | ORAL_TABLET | Freq: Four times a day (QID) | ORAL | Status: DC | PRN
  Administered 2023-06-05 – 2023-06-07 (×8): 650 mg via ORAL
  Filled 2023-06-04 (×8): qty 2

## 2023-06-04 MED ORDER — LACTATED RINGERS IV BOLUS
1000.0000 mL | Freq: Once | INTRAVENOUS | Status: AC
  Administered 2023-06-04: 1000 mL via INTRAVENOUS

## 2023-06-04 MED ORDER — ONDANSETRON HCL 4 MG/2ML IJ SOLN
4.0000 mg | Freq: Once | INTRAMUSCULAR | Status: AC
  Administered 2023-06-04: 4 mg via INTRAVENOUS
  Filled 2023-06-04: qty 2

## 2023-06-04 MED ORDER — ACETAMINOPHEN 650 MG RE SUPP: 650.0000 mg | Freq: Four times a day (QID) | RECTAL | Status: DC | PRN

## 2023-06-04 MED ORDER — SODIUM CHLORIDE 0.9 % IV BOLUS
500.0000 mL | Freq: Once | INTRAVENOUS | Status: AC
  Administered 2023-06-04: 500 mL via INTRAVENOUS

## 2023-06-04 MED ORDER — SODIUM CHLORIDE 0.9 % IV BOLUS
1000.0000 mL | Freq: Once | INTRAVENOUS | Status: AC
  Administered 2023-06-04: 1000 mL via INTRAVENOUS

## 2023-06-04 MED ORDER — ATORVASTATIN CALCIUM 20 MG PO TABS
20.0000 mg | ORAL_TABLET | Freq: Every day | ORAL | Status: DC
  Administered 2023-06-04 – 2023-06-07 (×4): 20 mg via ORAL
  Filled 2023-06-04 (×4): qty 1

## 2023-06-04 MED ORDER — METOPROLOL TARTRATE 5 MG/5ML IV SOLN: 5.0000 mg | Freq: Four times a day (QID) | INTRAVENOUS | Status: DC | PRN

## 2023-06-04 MED ORDER — SODIUM CHLORIDE (PF) 0.9 % IJ SOLN
INTRAMUSCULAR | Status: AC
  Filled 2023-06-04: qty 50

## 2023-06-04 MED ORDER — IOHEXOL 300 MG/ML  SOLN
100.0000 mL | Freq: Once | INTRAMUSCULAR | Status: AC | PRN
  Administered 2023-06-04: 100 mL via INTRAVENOUS

## 2023-06-04 MED ORDER — CIPROFLOXACIN IN D5W 400 MG/200ML IV SOLN
400.0000 mg | Freq: Two times a day (BID) | INTRAVENOUS | Status: DC
  Administered 2023-06-04: 400 mg via INTRAVENOUS
  Filled 2023-06-04: qty 200

## 2023-06-04 MED ORDER — ACETAMINOPHEN 500 MG PO TABS
1000.0000 mg | ORAL_TABLET | Freq: Once | ORAL | Status: AC
  Administered 2023-06-04: 1000 mg via ORAL
  Filled 2023-06-04: qty 2

## 2023-06-04 MED ORDER — ALBUTEROL SULFATE (2.5 MG/3ML) 0.083% IN NEBU: 2.5000 mg | INHALATION_SOLUTION | RESPIRATORY_TRACT | Status: DC | PRN

## 2023-06-04 MED ORDER — POTASSIUM CHLORIDE 20 MEQ PO PACK
60.0000 meq | PACK | Freq: Once | ORAL | Status: AC
  Administered 2023-06-04: 60 meq via ORAL
  Filled 2023-06-04: qty 3

## 2023-06-04 MED ORDER — CIPROFLOXACIN IN D5W 400 MG/200ML IV SOLN
400.0000 mg | Freq: Two times a day (BID) | INTRAVENOUS | Status: DC
  Administered 2023-06-05 – 2023-06-07 (×5): 400 mg via INTRAVENOUS
  Filled 2023-06-04 (×5): qty 200

## 2023-06-04 NOTE — ED Notes (Signed)
Pt given portable fan for comfort. Pt has no other needs at this time. Call light within reach, bed locked in lowest positing.

## 2023-06-04 NOTE — H&P (Signed)
History and Physical  Jordan Frazier HQI:[id number] DOB: [birth year] DOA: 06/04/2023  PCP: Pcp, No   Chief Complaint: Abdominal pain  HPI: Jordan Frazier is a 62 y.o. male with medical history significant for DM2 not on medications, hypertension, hyperlipidemia and prior history of bacteremia in 2015 of unclear etiology being admitted to the hospital with acute bacterial prostatitis.  Patient states he has been in his usual state of health until last night, when he started having severe lower abdominal pain radiating around to his back.  There was associated nausea, without vomiting.  He had a temperature of 102.  Felt like he had to have a bowel movement, but was unable to.  Denies any chest pain, shortness of breath, syncope.  On review of his chart, he does have prior history of diabetes, but declined treatment and wanted to try lifestyle changes.  ED Course: On evaluation in the ER, he had temperature 100.2, heart rate 107, blood pressure 149/81.  Saturating well on room air.  Work significant for leukocytosis 21,000, potassium 3.0.  CT scan as detailed below with evidence of prostatitis, ER provider performed rectal exam which demonstrated very tender prostate.  Patient was started on empiric IV ciprofloxacin, and hospitalist contacted for admission.  Review of Systems: Please see HPI for pertinent positives and negatives. A complete 10 system review of systems are otherwise negative.  Past Medical History:  Diagnosis Date   Hyperlipidemia    Hypertension    No past surgical history on file.  Social History:  has no history on file for tobacco use, alcohol use, and drug use.   Allergies  Allergen Reactions   Niacin Other (See Comments)    Itching   No family history on file.   Prior to Admission medications   Not on File   Physical Exam: BP (!) 149/81 (BP Location: Left Arm)   Pulse 95   Temp 100.2 F (37.9 C) (Oral)   Resp 16   Ht 5\' 11"  (1.803 m)   Wt 107 kg   SpO2 96%   BMI  32.90 kg/m   General:  Alert, oriented, calm, in no acute distress, his sister is at the bedside. Eyes: EOMI, clear conjuctivae, white sclerea Neck: supple, no masses, trachea mildline  Cardiovascular: RRR, no murmurs or rubs, no peripheral edema  Respiratory: clear to auscultation bilaterally, no wheezes, no crackles  Abdomen: soft, nontender, nondistended, normal bowel tones heard  Skin: dry, no rashes  Musculoskeletal: no joint effusions, normal range of motion  Psychiatric: appropriate affect, normal speech  Neurologic: extraocular muscles intact, clear speech, moving all extremities with intact sensorium          Labs on Admission:  Basic Metabolic Panel: Recent Labs  Lab 06/04/23 1101  NA 134*  K 3.0*  CL 102  CO2 23  GLUCOSE 145*  BUN 19  CREATININE 0.97  CALCIUM 8.8*   Liver Function Tests: Recent Labs  Lab 06/04/23 1101  AST 19  ALT 29  ALKPHOS 52  BILITOT 1.7*  PROT 7.3  ALBUMIN 3.9   Recent Labs  Lab 06/04/23 1101  LIPASE 25   No results for input(s): "AMMONIA" in the last 168 hours. CBC: Recent Labs  Lab 06/04/23 1101  WBC 21.5*  NEUTROABS 19.3*  HGB 15.2  HCT 45.1  MCV 86.1  PLT 228   Cardiac Enzymes: No results for input(s): "CKTOTAL", "CKMB", "CKMBINDEX", "TROPONINI" in the last 168 hours.  BNP (last 3 results) No results for input(s): "BNP" in the  last 8760 hours.  ProBNP (last 3 results) No results for input(s): "PROBNP" in the last 8760 hours.  CBG: No results for input(s): "GLUCAP" in the last 168 hours.  Radiological Exams on Admission: CT ABDOMEN PELVIS W CONTRAST  Result Date: 06/04/2023 CLINICAL DATA:  LLQ abdominal pain EXAM: CT ABDOMEN AND PELVIS WITH CONTRAST TECHNIQUE: Multidetector CT imaging of the abdomen and pelvis was performed using the standard protocol following bolus administration of intravenous contrast. RADIATION DOSE REDUCTION: This exam was performed according to the departmental dose-optimization program  which includes automated exposure control, adjustment of the mA and/or kV according to patient size and/or use of iterative reconstruction technique. CONTRAST:  OMNIPAQUE IOHEXOL 300 MG/ML  SOLN COMPARISON:  None Available. FINDINGS: Lower chest: No acute abnormality. Hepatobiliary: No focal liver abnormality is seen. No gallstones, gallbladder wall thickening, or biliary dilatation. Pancreas: No evidence of peripancreatic fat stranding to suggest pancreatitis. No evidence of pancreatic ductal dilatation. Spleen: Normal in size without focal abnormality. Adrenals/Urinary Tract: Bilateral adrenal glands are normal in appearance. Bilateral kidneys enhance homogeneously and symmetrically. The urinary bladder is decompressed with circumferential bladder wall thickening. The prostate appears enlarged with mild hyperenhancement and surrounding inflammatory fat stranding. Stomach/Bowel: Stomach is within normal limits. Appendix appears normal. No evidence of bowel wall thickening, distention, or inflammatory changes. Vascular/Lymphatic: No significant vascular findings are present. No enlarged abdominal or pelvic lymph nodes. Reproductive: Prostate appears enlarged with heterogeneous contrast enhancement and surrounding inflammatory fat stranding Other: No abdominal wall hernia or abnormality. No abdominopelvic ascites. Musculoskeletal: No acute or significant osseous findings. IMPRESSION: Inflammatory fat stranding surrounding the urinary bladder and prostate with circumferential bladder wall thickening and heterogenous contrast enhancement in the prostate. Findings could be seen in the setting of cystitis and/or prostatitis. Correlate with urinalysis and PSA. Electronically Signed   By: Lorenza Cambridge M.D.   On: 06/04/2023 14:45    Assessment/Plan 62 year old gentleman with history of type 2 diabetes untreated, hypertension, hyperlipidemia being admitted to the hospital with acute bacterial  prostatitis.  Prostatitis-with lower abdominal pain, fever, leukocytosis, tender prostate and CT changes.  He is meeting SIRS criteria, but likely not septic. -Observation admission -Obtain urine culture and urine Gram stain -Continue empiric IV ciprofloxacin, will likely need 4 to 6 weeks of outpatient antibiotics, and consider outpatient urology follow-up -Check lactate, ESR/CRP  Leukocytosis-due to prostatitis, treating as above  Hypokalemia-repleted orally in the ER, will check magnesium  Hypertension-continue home benazepril/HCTZ or equivalent once reconciled  Hyperlipidemia-atorvastatin 20 mg p.o. daily  DM2-check hemoglobin A1c, placed on carb controlled diet, with SSI in the meantime.   DVT prophylaxis: Lovenox     Code Status: Full Code  Consults called: None  Admission status: Observation  Time spent: 48 minutes  Jordan Aragones Sharlette Dense MD [facility] Pager [phone]  If 7PM-7AM, please contact night-coverage [email or web address] Password [facility]  06/04/2023, 4:07 PM

## 2023-06-04 NOTE — Progress Notes (Addendum)
    Patient Name: Jordan Frazier           DOB: 09-18-1961  MRN: [id number]      Admission Date: 06/04/2023  Attending Provider: Maryln Gottron, MD  Primary Diagnosis: Prostatitis, acute   Level of care: Progressive    CROSS COVER NOTE   Date of Service   06/04/2023   [name], 62 y.o. male, was admitted on 06/04/2023 for Prostatitis, acute.    HPI/Events of Note   Elevated lactic acidosis, uptrending from 1.9--> 2.4 Hemodynamically stable, afebrile.  No acute changes reported. Currently receiving IV Cipro for prostatitis   Interventions/ Plan   Fluid bolus, 500 cc Repeat lactic acid --> 1.9--> 2.4 --> 1.8        Anthoney Harada, DNP, [facility] [employer]

## 2023-06-04 NOTE — ED Notes (Signed)
ED TO INPATIENT HANDOFF REPORT  Name/Age/Gender Jordan Frazier 62 y.o. male  Code Status    Code Status Orders  (From admission, onward)           Start     Ordered   06/04/23 1605  Full code  Continuous       Question:  By:  Answer:  Consent: discussion documented in EHR   06/04/23 1607           Code Status History     This patient has a current code status but no historical code status.       Home/SNF/Other Home  Chief Complaint Prostatitis, acute [N41.0]  Level of Care/Admitting Diagnosis ED Disposition     ED Disposition  Admit   Condition  --   Comment  Hospital Area: [facility] [[id number]]  Level of Care: Progressive [102]  Admit to Progressive based on following criteria: MULTISYSTEM THREATS such as stable sepsis, metabolic/electrolyte imbalance with or without encephalopathy that is responding to early treatment.  May place patient in observation at [facility] or [facility] if equivalent level of care is available:: Yes  Covid Evaluation: Asymptomatic - no recent exposure (last 10 days) testing not required  Diagnosis: Prostatitis, acute [098119]  Admitting Physician: Maryln Gottron [[id number]]  Attending Physician: Kirby Crigler, MIR [email or web address] [[id number]]          Medical History Past Medical History:  Diagnosis Date   Hyperlipidemia    Hypertension     Allergies Allergies  Allergen Reactions   Niacin Other (See Comments)    Itching    IV Location/Drains/Wounds Patient Lines/Drains/Airways Status     Active Line/Drains/Airways     Name Placement date Placement time Site Days   Peripheral IV 06/04/23 20 G Right Antecubital 06/04/23  1103  Antecubital  less than 1            Labs/Imaging Results for orders placed or performed during the hospital encounter of 06/04/23 (from the past 48 hour(s))  Comprehensive metabolic panel     Status: Abnormal   Collection Time: 06/04/23 11:01 AM  Result Value Ref Range   Sodium  134 (L) 135 - 145 mmol/L   Potassium 3.0 (L) 3.5 - 5.1 mmol/L   Chloride 102 98 - 111 mmol/L   CO2 23 22 - 32 mmol/L   Glucose, Bld 145 (H) 70 - 99 mg/dL    Comment: Glucose reference range applies only to samples taken after fasting for at least 8 hours.   BUN 19 8 - 23 mg/dL   Creatinine, Ser 5.78 0.61 - 1.24 mg/dL   Calcium 8.8 (L) 8.9 - 10.3 mg/dL   Total Protein 7.3 6.5 - 8.1 g/dL   Albumin 3.9 3.5 - 5.0 g/dL   AST 19 15 - 41 U/L   ALT 29 0 - 44 U/L   Alkaline Phosphatase 52 38 - 126 U/L   Total Bilirubin 1.7 (H) 0.3 - 1.2 mg/dL   GFR, Estimated >46 >96 mL/min    Comment: (NOTE) Calculated using the CKD-EPI Creatinine Equation (2021)    Anion gap 9 5 - 15    Comment: Performed at [facility], [address], [place], [place] [address]  CBC with Differential     Status: Abnormal   Collection Time: 06/04/23 11:01 AM  Result Value Ref Range   WBC 21.5 (H) 4.0 - 10.5 K/uL   RBC 5.24 4.22 - 5.81 MIL/uL   Hemoglobin 15.2 13.0 - 17.0  g/dL   HCT 16.1 09.6 - 04.5 %   MCV 86.1 80.0 - 100.0 fL   MCH 29.0 26.0 - 34.0 pg   MCHC 33.7 30.0 - 36.0 g/dL   RDW 40.9 81.1 - 91.4 %   Platelets 228 150 - 400 K/uL   nRBC 0.0 0.0 - 0.2 %   Neutrophils Relative % 90 %   Neutro Abs 19.3 (H) 1.7 - 7.7 K/uL   Lymphocytes Relative 3 %   Lymphs Abs 0.6 (L) 0.7 - 4.0 K/uL   Monocytes Relative 6 %   Monocytes Absolute 1.4 (H) 0.1 - 1.0 K/uL   Eosinophils Relative 0 %   Eosinophils Absolute 0.0 0.0 - 0.5 K/uL   Basophils Relative 0 %   Basophils Absolute 0.0 0.0 - 0.1 K/uL   Immature Granulocytes 1 %   Abs Immature Granulocytes 0.17 (H) 0.00 - 0.07 K/uL    Comment: Performed at [facility], [address], [place], [place] [address]  Lipase, blood     Status: None   Collection Time: 06/04/23 11:01 AM  Result Value Ref Range   Lipase 25 11 - 51 U/L    Comment: Performed at [facility], [address] [place], [place] [address]   Urinalysis, w/ Reflex to Culture (Infection Suspected) -Urine, Clean Catch     Status: Abnormal   Collection Time: 06/04/23  3:02 PM  Result Value Ref Range   Specimen Source URINE, CLEAN CATCH    Color, Urine YELLOW YELLOW   APPearance HAZY (A) CLEAR   Specific Gravity, Urine <=1.005 1.005 - 1.030   pH 5.0 5.0 - 8.0   Glucose, UA NEGATIVE NEGATIVE mg/dL   Hgb urine dipstick MODERATE (A) NEGATIVE   Bilirubin Urine NEGATIVE NEGATIVE   Ketones, ur NEGATIVE NEGATIVE mg/dL   Protein, ur NEGATIVE NEGATIVE mg/dL   Nitrite NEGATIVE NEGATIVE   Leukocytes,Ua LARGE (A) NEGATIVE   RBC / HPF 11-20 0 - 5 RBC/hpf   WBC, UA >50 0 - 5 WBC/hpf    Comment:        Reflex urine culture not performed if WBC <=10, OR if Squamous epithelial cells >5. If Squamous epithelial cells >5 suggest recollection.    Bacteria, UA RARE (A) NONE SEEN   Squamous Epithelial / HPF 0-5 0 - 5 /HPF    Comment: Performed at [facility], [address] [place], [place] [address]   CT ABDOMEN PELVIS W CONTRAST  Result Date: 06/04/2023 CLINICAL DATA:  LLQ abdominal pain EXAM: CT ABDOMEN AND PELVIS WITH CONTRAST TECHNIQUE: Multidetector CT imaging of the abdomen and pelvis was performed using the standard protocol following bolus administration of intravenous contrast. RADIATION DOSE REDUCTION: This exam was performed according to the departmental dose-optimization program which includes automated exposure control, adjustment of the mA and/or kV according to patient size and/or use of iterative reconstruction technique. CONTRAST:  OMNIPAQUE IOHEXOL 300 MG/ML  SOLN COMPARISON:  None Available. FINDINGS: Lower chest: No acute abnormality. Hepatobiliary: No focal liver abnormality is seen. No gallstones, gallbladder wall thickening, or biliary dilatation. Pancreas: No evidence of peripancreatic fat stranding to suggest pancreatitis. No evidence of pancreatic ductal dilatation. Spleen: Normal in size without  focal abnormality. Adrenals/Urinary Tract: Bilateral adrenal glands are normal in appearance. Bilateral kidneys enhance homogeneously and symmetrically. The urinary bladder is decompressed with circumferential bladder wall thickening. The prostate appears enlarged with mild hyperenhancement and surrounding inflammatory fat stranding. Stomach/Bowel: Stomach is within normal limits. Appendix appears normal.  No evidence of bowel wall thickening, distention, or inflammatory changes. Vascular/Lymphatic: No significant vascular findings are present. No enlarged abdominal or pelvic lymph nodes. Reproductive: Prostate appears enlarged with heterogeneous contrast enhancement and surrounding inflammatory fat stranding Other: No abdominal wall hernia or abnormality. No abdominopelvic ascites. Musculoskeletal: No acute or significant osseous findings. IMPRESSION: Inflammatory fat stranding surrounding the urinary bladder and prostate with circumferential bladder wall thickening and heterogenous contrast enhancement in the prostate. Findings could be seen in the setting of cystitis and/or prostatitis. Correlate with urinalysis and PSA. Electronically Signed   By: Lorenza Cambridge M.D.   On: 06/04/2023 14:45    Pending Labs Unresulted Labs (From admission, onward)     Start     Ordered   06/05/23 0500  Basic metabolic panel  Tomorrow morning,   R        06/04/23 1607   06/05/23 0500  CBC  Tomorrow morning,   R        06/04/23 1607   06/04/23 1606  Hemoglobin A1c  (Glycemic Control (SSI)  Q 4 Hours / Glycemic Control (SSI)  AC +/- HS)  Once,   R       Comments: To assess prior glycemic control    06/04/23 1607   06/04/23 1605  Magnesium  Add-on,   AD        06/04/23 1604   06/04/23 1605  HIV Antibody (routine testing w rflx)  (HIV Antibody (Routine testing w reflex) panel)  Once,   R        06/04/23 1607   06/04/23 1604  Lactic acid, plasma  STAT Now then every 3 hours,   R (with STAT occurrences)      06/04/23  1603   06/04/23 1603  Sedimentation rate  Add-on,   AD        06/04/23 1602   06/04/23 1603  C-reactive protein  Add-on,   AD        06/04/23 1602   06/04/23 1601  Urine Culture (for pregnant, neutropenic or urologic patients or patients with an indwelling urinary catheter)  (Urine Labs)  Once,   URGENT       Question:  Indication  Answer:  Dysuria   06/04/23 1600   06/04/23 1601  Gram stain  ONCE - STAT,   URGENT        06/04/23 1600   06/04/23 1553  Culture, blood (routine x 2)  BLOOD CULTURE X 2,   R (with STAT occurrences)      06/04/23 1552            Vitals/Pain Today's Vitals   06/04/23 1045 06/04/23 1100 06/04/23 1433 06/04/23 1512  BP:   (!) 149/81   Pulse: (!) 103 (!) 105 95   Resp:   16   Temp:   100.2 F (37.9 C)   TempSrc:   Oral   SpO2: 95% 95% 96%   Weight:      Height:      PainSc:    6     Isolation Precautions No active isolations  Medications Medications  ciprofloxacin (CIPRO) IVPB 400 mg (has no administration in time range)  lactated ringers bolus 1,000 mL (has no administration in time range)  potassium chloride (KLOR-CON) packet 60 mEq (has no administration in time range)  enoxaparin (LOVENOX) injection 40 mg (has no administration in time range)  insulin aspart (novoLOG) injection 0-15 Units (has no administration in time range)  insulin aspart (novoLOG) injection 0-5 Units (  has no administration in time range)  acetaminophen (TYLENOL) tablet 650 mg (has no administration in time range)    Or  acetaminophen (TYLENOL) suppository 650 mg (has no administration in time range)  traZODone (DESYREL) tablet 25 mg (has no administration in time range)  ondansetron (ZOFRAN) tablet 4 mg (has no administration in time range)    Or  ondansetron (ZOFRAN) injection 4 mg (has no administration in time range)  albuterol (PROVENTIL) (2.5 MG/3ML) 0.083% nebulizer solution 2.5 mg (has no administration in time range)  metoprolol tartrate (LOPRESSOR)  injection 5 mg (has no administration in time range)  atorvastatin (LIPITOR) tablet 20 mg (has no administration in time range)  sodium chloride 0.9 % bolus 1,000 mL (1,000 mLs Intravenous New Bag/Given 06/04/23 1115)  ondansetron (ZOFRAN) injection 4 mg (4 mg Intravenous Given 06/04/23 1207)  iohexol (OMNIPAQUE) 300 MG/ML solution 100 mL (100 mLs Intravenous Contrast Given 06/04/23 1415)  morphine (PF) 4 MG/ML injection 4 mg (4 mg Intravenous Given 06/04/23 1436)  acetaminophen (TYLENOL) tablet 1,000 mg (1,000 mg Oral Given 06/04/23 1512)    Mobility walks

## 2023-06-04 NOTE — ED Triage Notes (Signed)
BIBA from home reporting abdominal pain and fever that started last night. Patient states he hadn't had a BM in two days and thought he was just constipated. AAOx4, NAD, MAE.  Denies any N/V/D. Tylenol taken PTA. Afebrile at this time.

## 2023-06-04 NOTE — ED Provider Notes (Signed)
[place] EMERGENCY DEPARTMENT AT [facility] Provider Note  CSN: [id number] Arrival date & time: 06/04/23 0941  Chief Complaint(s) Abdominal Pain  HPI Jordan Frazier is a 62 y.o. male history of hyperlipidemia, hypertension presenting to the emergency department abdominal pain.  Patient reports abdominal pain in the lower abdomen.  Reports associated fevers and chills at home.  Reports subjective fever.  No nausea, vomiting.  Denies any urinary symptoms.  No diarrhea, feels some sensation of constipation.  Reports currently his symptoms are improved.  Denies similar episode in the past.   Past Medical History Past Medical History:  Diagnosis Date   Hyperlipidemia    Hypertension    Patient Active Problem List   Diagnosis Date Noted   Prostatitis, acute 06/04/2023   Home Medication(s) Prior to Admission medications   Not on File                                                                                                                                    Past Surgical History No past surgical history on file. Family History No family history on file.  Social History   Allergies Niacin  Review of Systems Review of Systems  All other systems reviewed and are negative.   Physical Exam Vital Signs  I have reviewed the triage vital signs BP (!) 149/81 (BP Location: Left Arm)   Pulse 95   Temp 100.2 F (37.9 C) (Oral)   Resp 16   Ht 5\' 11"  (1.803 m)   Wt 107 kg   SpO2 96%   BMI 32.90 kg/m  Physical Exam Vitals and nursing note reviewed.  Constitutional:      General: He is not in acute distress.    Appearance: Normal appearance.  HENT:     Mouth/Throat:     Mouth: Mucous membranes are moist.  Eyes:     Conjunctiva/sclera: Conjunctivae normal.  Cardiovascular:     Rate and Rhythm: Normal rate and regular rhythm.  Pulmonary:     Effort: Pulmonary effort is normal. No respiratory distress.     Breath sounds: Normal breath sounds.  Abdominal:      General: Abdomen is flat.     Palpations: Abdomen is soft.     Tenderness: There is abdominal tenderness in the suprapubic area and left lower quadrant. There is left CVA tenderness.  Genitourinary:    Comments: Prostate exam chaperoned by RN, enlarged prostate with significant tenderness over the prostate Musculoskeletal:     Right lower leg: No edema.     Left lower leg: No edema.  Skin:    General: Skin is warm and dry.     Capillary Refill: Capillary refill takes less than 2 seconds.  Neurological:     Mental Status: He is alert and oriented to person, place, and time. Mental status is at baseline.  Psychiatric:        Mood and Affect: Mood  normal.        Behavior: Behavior normal.     ED Results and Treatments Labs (all labs ordered are listed, but only abnormal results are displayed) Labs Reviewed  COMPREHENSIVE METABOLIC PANEL - Abnormal; Notable for the following components:      Result Value   Sodium 134 (*)    Potassium 3.0 (*)    Glucose, Bld 145 (*)    Calcium 8.8 (*)    Total Bilirubin 1.7 (*)    All other components within normal limits  CBC WITH DIFFERENTIAL/PLATELET - Abnormal; Notable for the following components:   WBC 21.5 (*)    Neutro Abs 19.3 (*)    Lymphs Abs 0.6 (*)    Monocytes Absolute 1.4 (*)    Abs Immature Granulocytes 0.17 (*)    All other components within normal limits  URINALYSIS, W/ REFLEX TO CULTURE (INFECTION SUSPECTED) - Abnormal; Notable for the following components:   APPearance HAZY (*)    Hgb urine dipstick MODERATE (*)    Leukocytes,Ua LARGE (*)    Bacteria, UA RARE (*)    All other components within normal limits  CULTURE, BLOOD (ROUTINE X 2)  CULTURE, BLOOD (ROUTINE X 2)  URINE CULTURE  GRAM STAIN  LIPASE, BLOOD  SEDIMENTATION RATE  C-REACTIVE PROTEIN  LACTIC ACID, PLASMA  LACTIC ACID, PLASMA  MAGNESIUM  HIV ANTIBODY (ROUTINE TESTING W REFLEX)  HEMOGLOBIN A1C  BASIC METABOLIC PANEL  CBC                                                                                                                           Radiology CT ABDOMEN PELVIS W CONTRAST  Result Date: 06/04/2023 CLINICAL DATA:  LLQ abdominal pain EXAM: CT ABDOMEN AND PELVIS WITH CONTRAST TECHNIQUE: Multidetector CT imaging of the abdomen and pelvis was performed using the standard protocol following bolus administration of intravenous contrast. RADIATION DOSE REDUCTION: This exam was performed according to the departmental dose-optimization program which includes automated exposure control, adjustment of the mA and/or kV according to patient size and/or use of iterative reconstruction technique. CONTRAST:  OMNIPAQUE IOHEXOL 300 MG/ML  SOLN COMPARISON:  None Available. FINDINGS: Lower chest: No acute abnormality. Hepatobiliary: No focal liver abnormality is seen. No gallstones, gallbladder wall thickening, or biliary dilatation. Pancreas: No evidence of peripancreatic fat stranding to suggest pancreatitis. No evidence of pancreatic ductal dilatation. Spleen: Normal in size without focal abnormality. Adrenals/Urinary Tract: Bilateral adrenal glands are normal in appearance. Bilateral kidneys enhance homogeneously and symmetrically. The urinary bladder is decompressed with circumferential bladder wall thickening. The prostate appears enlarged with mild hyperenhancement and surrounding inflammatory fat stranding. Stomach/Bowel: Stomach is within normal limits. Appendix appears normal. No evidence of bowel wall thickening, distention, or inflammatory changes. Vascular/Lymphatic: No significant vascular findings are present. No enlarged abdominal or pelvic lymph nodes. Reproductive: Prostate appears enlarged with heterogeneous contrast enhancement and surrounding inflammatory fat stranding Other: No abdominal wall hernia or abnormality. No abdominopelvic ascites. Musculoskeletal: No  acute or significant osseous findings. IMPRESSION: Inflammatory fat stranding  surrounding the urinary bladder and prostate with circumferential bladder wall thickening and heterogenous contrast enhancement in the prostate. Findings could be seen in the setting of cystitis and/or prostatitis. Correlate with urinalysis and PSA. Electronically Signed   By: Lorenza Cambridge M.D.   On: 06/04/2023 14:45    Pertinent labs & imaging results that were available during my care of the patient were reviewed by me and considered in my medical decision making (see MDM for details).  Medications Ordered in ED Medications  ciprofloxacin (CIPRO) IVPB 400 mg (400 mg Intravenous New Bag/Given 06/04/23 1700)  enoxaparin (LOVENOX) injection 40 mg (has no administration in time range)  insulin aspart (novoLOG) injection 0-15 Units (0 Units Subcutaneous Hold 06/04/23 1701)  insulin aspart (novoLOG) injection 0-5 Units (has no administration in time range)  acetaminophen (TYLENOL) tablet 650 mg (has no administration in time range)    Or  acetaminophen (TYLENOL) suppository 650 mg (has no administration in time range)  traZODone (DESYREL) tablet 25 mg (has no administration in time range)  ondansetron (ZOFRAN) tablet 4 mg (has no administration in time range)    Or  ondansetron (ZOFRAN) injection 4 mg (has no administration in time range)  albuterol (PROVENTIL) (2.5 MG/3ML) 0.083% nebulizer solution 2.5 mg (has no administration in time range)  metoprolol tartrate (LOPRESSOR) injection 5 mg (has no administration in time range)  atorvastatin (LIPITOR) tablet 20 mg (has no administration in time range)  sodium chloride 0.9 % bolus 1,000 mL (0 mLs Intravenous Stopped 06/04/23 1600)  ondansetron (ZOFRAN) injection 4 mg (4 mg Intravenous Given 06/04/23 1207)  iohexol (OMNIPAQUE) 300 MG/ML solution 100 mL (100 mLs Intravenous Contrast Given 06/04/23 1415)  morphine (PF) 4 MG/ML injection 4 mg (4 mg Intravenous Given 06/04/23 1436)  acetaminophen (TYLENOL) tablet 1,000 mg (1,000 mg Oral Given 06/04/23 1512)   lactated ringers bolus 1,000 mL (1,000 mLs Intravenous New Bag/Given 06/04/23 1659)  potassium chloride (KLOR-CON) packet 60 mEq (60 mEq Oral Given 06/04/23 1658)                                                                                                                                     Procedures Procedures  (including critical care time)  Medical Decision Making / ED Course   MDM:  62 year old male presenting to the emergency department lower abdominal pain.  Patient overall well-appearing, vitals notable for mild tachycardia, borderline fever.  Suspect cause of patient's symptoms is likely prostatitis, possible urinary infection.  Patient has enlarged prostate on physical exam with tenderness.  CT scan obtained which shows signs of urinary infection with inflammation around the prostate.  Labs notable for leukocytosis.  No other acute process such as diverticulitis, nephrolithiasis to explain the patient's symptoms.  He does have some left CVA tenderness and may have component of pyelonephritis.  Given systemic symptoms and fever, discussed treatment inpatient  versus treatment at home, patient preferred to to stay in the hospital for observation given his significant symptoms.  Given his age and leukocytosis I think this is reasonable, discussed with the hospitalist will admit the patient.      Additional history obtained: -Additional history obtained from family -External records from outside source obtained and reviewed including: Chart review including previous notes, labs, imaging, consultation notes including prior ER  visits for sutures   Lab Tests: -I ordered, reviewed, and interpreted labs.   The pertinent results include:   Labs Reviewed  COMPREHENSIVE METABOLIC PANEL - Abnormal; Notable for the following components:      Result Value   Sodium 134 (*)    Potassium 3.0 (*)    Glucose, Bld 145 (*)    Calcium 8.8 (*)    Total Bilirubin 1.7 (*)    All other  components within normal limits  CBC WITH DIFFERENTIAL/PLATELET - Abnormal; Notable for the following components:   WBC 21.5 (*)    Neutro Abs 19.3 (*)    Lymphs Abs 0.6 (*)    Monocytes Absolute 1.4 (*)    Abs Immature Granulocytes 0.17 (*)    All other components within normal limits  URINALYSIS, W/ REFLEX TO CULTURE (INFECTION SUSPECTED) - Abnormal; Notable for the following components:   APPearance HAZY (*)    Hgb urine dipstick MODERATE (*)    Leukocytes,Ua LARGE (*)    Bacteria, UA RARE (*)    All other components within normal limits  CULTURE, BLOOD (ROUTINE X 2)  CULTURE, BLOOD (ROUTINE X 2)  URINE CULTURE  GRAM STAIN  LIPASE, BLOOD  SEDIMENTATION RATE  C-REACTIVE PROTEIN  LACTIC ACID, PLASMA  LACTIC ACID, PLASMA  MAGNESIUM  HIV ANTIBODY (ROUTINE TESTING W REFLEX)  HEMOGLOBIN A1C  BASIC METABOLIC PANEL  CBC    Notable for leukocytosis, mild hypokalemia   Imaging Studies ordered: I ordered imaging studies including CT abdomen On my interpretation imaging demonstrates prostatitis/UTI I independently visualized and interpreted imaging. I agree with the radiologist interpretation   Medicines ordered and prescription drug management: Meds ordered this encounter  Medications   sodium chloride 0.9 % bolus 1,000 mL   ondansetron (ZOFRAN) injection 4 mg   iohexol (OMNIPAQUE) 300 MG/ML solution 100 mL   morphine (PF) 4 MG/ML injection 4 mg   acetaminophen (TYLENOL) tablet 1,000 mg   ciprofloxacin (CIPRO) IVPB 400 mg    Order Specific Question:   Antibiotic Indication:    Answer:   UTI   lactated ringers bolus 1,000 mL   potassium chloride (KLOR-CON) packet 60 mEq   enoxaparin (LOVENOX) injection 40 mg   insulin aspart (novoLOG) injection 0-15 Units    Order Specific Question:   Correction coverage:    Answer:   Moderate (average weight, post-op)    Order Specific Question:   CBG < 70:    Answer:   Implement Hypoglycemia Standing Orders and refer to Hypoglycemia  Standing Orders sidebar report    Order Specific Question:   CBG 70 - 120:    Answer:   0 units    Order Specific Question:   CBG 121 - 150:    Answer:   2 units    Order Specific Question:   CBG 151 - 200:    Answer:   3 units    Order Specific Question:   CBG 201 - 250:    Answer:   5 units    Order Specific Question:   CBG 251 - 300:  Answer:   8 units    Order Specific Question:   CBG 301 - 350:    Answer:   11 units    Order Specific Question:   CBG 351 - 400:    Answer:   15 units    Order Specific Question:   CBG > 400    Answer:   call MD and obtain STAT lab verification   insulin aspart (novoLOG) injection 0-5 Units    Order Specific Question:   Correction coverage:    Answer:   HS scale    Order Specific Question:   CBG < 70:    Answer:   Implement Hypoglycemia Standing Orders and refer to Hypoglycemia Standing Orders sidebar report    Order Specific Question:   CBG 70 - 120:    Answer:   0 units    Order Specific Question:   CBG 121 - 150:    Answer:   0 units    Order Specific Question:   CBG 151 - 200:    Answer:   0 units    Order Specific Question:   CBG 201 - 250:    Answer:   2 units    Order Specific Question:   CBG 251 - 300:    Answer:   3 units    Order Specific Question:   CBG 301 - 350:    Answer:   4 units    Order Specific Question:   CBG 351 - 400:    Answer:   5 units    Order Specific Question:   CBG > 400    Answer:   call MD and obtain STAT lab verification   OR Linked Order Group    acetaminophen (TYLENOL) tablet 650 mg    acetaminophen (TYLENOL) suppository 650 mg   traZODone (DESYREL) tablet 25 mg   OR Linked Order Group    ondansetron (ZOFRAN) tablet 4 mg    ondansetron (ZOFRAN) injection 4 mg   albuterol (PROVENTIL) (2.5 MG/3ML) 0.083% nebulizer solution 2.5 mg   metoprolol tartrate (LOPRESSOR) injection 5 mg   atorvastatin (LIPITOR) tablet 20 mg    -I have reviewed the patients home medicines and have made adjustments as  needed   Consultations Obtained: I requested consultation with the hospitalist,  and discussed lab and imaging findings as well as pertinent plan - they recommend: admission   Cardiac Monitoring: The patient was maintained on a cardiac monitor.  I personally viewed and interpreted the cardiac monitored which showed an underlying rhythm of: sinus tachycardia  Social Determinants of Health:  Diagnosis or treatment significantly limited by social determinants of health: obesity and lives alone   Reevaluation: After the interventions noted above, I reevaluated the patient and found that their symptoms have improved  Co morbidities that complicate the patient evaluation  Past Medical History:  Diagnosis Date   Hyperlipidemia    Hypertension       Dispostion: Disposition decision including need for hospitalization was considered, and patient admitted to the hospital.    Final Clinical Impression(s) / ED Diagnoses Final diagnoses:  Acute prostatitis     This chart was dictated using voice recognition software.  Despite best efforts to proofread,  errors can occur which can change the documentation meaning.    Lonell Grandchild, MD 06/04/23 [phone]

## 2023-06-04 NOTE — ED Notes (Signed)
Called CT to f/u on status. States there is currently one patient in front of him

## 2023-06-05 DIAGNOSIS — N390 Urinary tract infection, site not specified: Secondary | ICD-10-CM | POA: Diagnosis present

## 2023-06-05 DIAGNOSIS — N410 Acute prostatitis: Secondary | ICD-10-CM | POA: Diagnosis present

## 2023-06-05 DIAGNOSIS — B9689 Other specified bacterial agents as the cause of diseases classified elsewhere: Secondary | ICD-10-CM | POA: Diagnosis present

## 2023-06-05 DIAGNOSIS — E119 Type 2 diabetes mellitus without complications: Secondary | ICD-10-CM

## 2023-06-05 DIAGNOSIS — I10 Essential (primary) hypertension: Secondary | ICD-10-CM | POA: Diagnosis present

## 2023-06-05 DIAGNOSIS — A419 Sepsis, unspecified organism: Secondary | ICD-10-CM

## 2023-06-05 DIAGNOSIS — A4152 Sepsis due to Pseudomonas: Secondary | ICD-10-CM | POA: Diagnosis present

## 2023-06-05 DIAGNOSIS — R6520 Severe sepsis without septic shock: Secondary | ICD-10-CM | POA: Insufficient documentation

## 2023-06-05 DIAGNOSIS — Z79899 Other long term (current) drug therapy: Secondary | ICD-10-CM | POA: Diagnosis not present

## 2023-06-05 DIAGNOSIS — A4153 Sepsis due to Serratia: Secondary | ICD-10-CM | POA: Diagnosis present

## 2023-06-05 DIAGNOSIS — E669 Obesity, unspecified: Secondary | ICD-10-CM | POA: Diagnosis present

## 2023-06-05 DIAGNOSIS — N400 Enlarged prostate without lower urinary tract symptoms: Secondary | ICD-10-CM | POA: Diagnosis present

## 2023-06-05 DIAGNOSIS — E872 Acidosis, unspecified: Secondary | ICD-10-CM | POA: Diagnosis present

## 2023-06-05 DIAGNOSIS — E785 Hyperlipidemia, unspecified: Secondary | ICD-10-CM | POA: Diagnosis present

## 2023-06-05 DIAGNOSIS — E876 Hypokalemia: Secondary | ICD-10-CM | POA: Diagnosis present

## 2023-06-05 DIAGNOSIS — Z6832 Body mass index (BMI) 32.0-32.9, adult: Secondary | ICD-10-CM | POA: Diagnosis not present

## 2023-06-05 LAB — GLUCOSE, CAPILLARY
Glucose-Capillary: 141 mg/dL — ABNORMAL HIGH (ref 70–99)
Glucose-Capillary: 136 mg/dL — ABNORMAL HIGH (ref 70–99)
Glucose-Capillary: 128 mg/dL — ABNORMAL HIGH (ref 70–99)
Glucose-Capillary: 127 mg/dL — ABNORMAL HIGH (ref 70–99)

## 2023-06-05 LAB — BASIC METABOLIC PANEL
Anion gap: 10 (ref 5–15)
BUN: 24 mg/dL — ABNORMAL HIGH (ref 8–23)
CO2: 20 mmol/L — ABNORMAL LOW (ref 22–32)
Calcium: 8.2 mg/dL — ABNORMAL LOW (ref 8.9–10.3)
Chloride: 103 mmol/L (ref 98–111)
Creatinine, Ser: 1.04 mg/dL (ref 0.61–1.24)
GFR, Estimated: >60 mL/min (ref >60)
Glucose, Bld: 155 mg/dL — ABNORMAL HIGH (ref 70–99)
Potassium: 3.5 mmol/L (ref 3.5–5.1)
Sodium: 133 mmol/L — ABNORMAL LOW (ref 135–145)

## 2023-06-05 LAB — CULTURE, BLOOD (ROUTINE X 2): Culture: NO GROWTH

## 2023-06-05 LAB — CBC
HCT: 44.4 % (ref 39.0–52.0)
Hemoglobin: 14.3 g/dL (ref 13.0–17.0)
MCH: 28.5 pg (ref 26.0–34.0)
MCHC: 32.2 g/dL (ref 30.0–36.0)
MCV: 88.6 fL (ref 80.0–100.0)
Platelets: 203 10*3/uL (ref 150–400)
RBC: 5.01 MIL/uL (ref 4.22–5.81)
RDW: 15 % (ref 11.5–15.5)
WBC: 27.6 10*3/uL — ABNORMAL HIGH (ref 4.0–10.5)
nRBC: 0 % (ref 0.0–0.2)

## 2023-06-05 LAB — HEMOGLOBIN A1C
Hgb A1c MFr Bld: 6.5 % — ABNORMAL HIGH (ref 4.8–5.6)
Mean Plasma Glucose: 140 mg/dL

## 2023-06-05 LAB — LACTIC ACID, PLASMA: Lactic Acid, Venous: 1.8 mmol/L (ref 0.5–1.9)

## 2023-06-05 MED ORDER — BENAZEPRIL-HYDROCHLOROTHIAZIDE 20-12.5 MG PO TABS: 1.0000 | ORAL_TABLET | Freq: Every day | ORAL | Status: DC

## 2023-06-05 MED ORDER — BENAZEPRIL HCL 20 MG PO TABS
20.0000 mg | ORAL_TABLET | Freq: Every day | ORAL | Status: DC
  Administered 2023-06-05 – 2023-06-07 (×3): 20 mg via ORAL
  Filled 2023-06-05 (×3): qty 1

## 2023-06-05 MED ORDER — HYDROCHLOROTHIAZIDE 12.5 MG PO TABS
ORAL_TABLET | ORAL | Status: AC
  Filled 2023-06-05: qty 1

## 2023-06-05 MED ORDER — HYDROCHLOROTHIAZIDE 12.5 MG PO TABS
12.5000 mg | ORAL_TABLET | Freq: Every day | ORAL | Status: DC
  Administered 2023-06-05 – 2023-06-07 (×3): 12.5 mg via ORAL
  Filled 2023-06-05 (×2): qty 1

## 2023-06-05 MED ORDER — MONTELUKAST SODIUM 10 MG PO TABS
10.0000 mg | ORAL_TABLET | Freq: Every day | ORAL | Status: DC
  Administered 2023-06-05 – 2023-06-06 (×2): 10 mg via ORAL
  Filled 2023-06-05 (×2): qty 1

## 2023-06-05 NOTE — Progress Notes (Signed)
   06/05/23 0607  Assess: MEWS Score  Temp (!) 101.9 F (38.8 C)  BP 133/77  MAP (mmHg) 92  Pulse Rate 97  SpO2 94 %  O2 Device Room Air  Assess: MEWS Score  MEWS Temp 2  MEWS Systolic 0  MEWS Pulse 0  MEWS RR 0  MEWS LOC 0  MEWS Score 2  MEWS Score Color Yellow  Assess: if the MEWS score is Yellow or Red  Were vital signs taken at a resting state? Yes  Focused Assessment No change from prior assessment  Does the patient meet 2 or more of the SIRS criteria? Yes  Does the patient have a confirmed or suspected source of infection? Yes  MEWS guidelines implemented  Yes, yellow  Treat  MEWS Interventions Considered administering scheduled or prn medications/treatments as ordered  Take Vital Signs  Increase Vital Sign Frequency  Yellow: Q2hr x1, continue Q4hrs until patient remains green for 12hrs  Escalate  MEWS: Escalate Yellow: Discuss with charge nurse and consider notifying provider and/or RRT  Notify: Charge Nurse/RN  Name of Charge Nurse/RN Notified Lauren, RN  Provider Notification  Provider Name/Title Anthoney Harada, NP  Date Provider Notified 06/05/23  Assess: SIRS CRITERIA  SIRS Temperature  1  SIRS Pulse 1  SIRS Respirations  0  SIRS WBC 1  SIRS Score Sum  3

## 2023-06-05 NOTE — Progress Notes (Signed)
  Progress Note   Patient: Jordan Frazier [id number] DOB: 30-Aug-1961 DOA: 06/04/2023     0 DOS: the patient was seen and examined on 06/05/2023   Brief hospital course: 61yom PMH DM type 2 diet-controlled presented w/ chills and myalgias, difficulty urinating. Admitted for severe sepsis secondary to acute prostatitis.  Assessment and Plan: Severe sepsis (fever, leukocytosis, elevated lactate) secondary to Acute prostatitis WBC 21.5 on admission. CT suggested prostatitis. No previous history WBC up to 27.6 today; still febrile.  Follow UC and BC. Continue empiric Cipro at least 4 weeks  DM2 diet controlled A1c 6.5 Diet-controlled; SSI   Hypokalemia Resolved    Essential hypertension Stable, continue home benazepril/HCTZ     Hyperlipidemia-atorvastatin          Subjective:  Feels better But still having fever No previous prostate problems  Physical Exam: Vitals:   06/04/23 1759 06/04/23 2154 06/05/23 0607 06/05/23 1105  BP: 133/64 131/64 133/77 129/75  Pulse: 96 98 97 90  Resp: 18 18  (!) 21  Temp: 99 F (37.2 C) 99.7 F (37.6 C) (!) 101.9 F (38.8 C) 98.7 F (37.1 C)  TempSrc: Oral Oral Oral Oral  SpO2: 95% 96% 94% 100%  Weight:      Height:       Physical Exam Vitals reviewed.  Constitutional:      General: He is not in acute distress.    Appearance: He is not ill-appearing or toxic-appearing.  Cardiovascular:     Rate and Rhythm: Normal rate and regular rhythm.     Heart sounds: No murmur heard. Pulmonary:     Effort: Pulmonary effort is normal. No respiratory distress.     Breath sounds: No wheezing, rhonchi or rales.  Neurological:     Mental Status: He is alert.  Psychiatric:        Mood and Affect: Mood normal.        Behavior: Behavior normal.     Data Reviewed: CBG noted and stable Lactic acid down to 1.8 WBC 21.5 > 27.6 UC, BC NGTD CT abd/pelvis noted  Family Communication: none  Disposition: Status is: Observation      Time  spent: 20 minutes  Author: Brendia Sacks, MD 06/05/2023 11:41 AM  For on call review [email or web address].

## 2023-06-05 NOTE — Hospital Course (Addendum)
61yom PMH DM type 2 diet-controlled presented w/ chills and myalgias, difficulty urinating. Admitted for severe sepsis secondary to acute prostatitis.

## 2023-06-05 NOTE — Progress Notes (Signed)
Patient refusing insulin this shift. Pt educated regarding benefit of medication. Pt states he does not take insulin at home and does not want it here. MD Irene Limbo made aware.

## 2023-06-06 DIAGNOSIS — A419 Sepsis, unspecified organism: Secondary | ICD-10-CM | POA: Diagnosis not present

## 2023-06-06 DIAGNOSIS — E119 Type 2 diabetes mellitus without complications: Secondary | ICD-10-CM | POA: Diagnosis not present

## 2023-06-06 DIAGNOSIS — N410 Acute prostatitis: Secondary | ICD-10-CM | POA: Diagnosis not present

## 2023-06-06 DIAGNOSIS — R6520 Severe sepsis without septic shock: Secondary | ICD-10-CM | POA: Diagnosis not present

## 2023-06-06 LAB — GLUCOSE, CAPILLARY
Glucose-Capillary: 130 mg/dL — ABNORMAL HIGH (ref 70–99)
Glucose-Capillary: 153 mg/dL — ABNORMAL HIGH (ref 70–99)
Glucose-Capillary: 160 mg/dL — ABNORMAL HIGH (ref 70–99)
Glucose-Capillary: 139 mg/dL — ABNORMAL HIGH (ref 70–99)

## 2023-06-06 LAB — CBC
HCT: 39.9 % (ref 39.0–52.0)
Hemoglobin: 13.3 g/dL (ref 13.0–17.0)
MCH: 29 pg (ref 26.0–34.0)
MCHC: 33.3 g/dL (ref 30.0–36.0)
MCV: 86.9 fL (ref 80.0–100.0)
Platelets: 164 10*3/uL (ref 150–400)
RBC: 4.59 MIL/uL (ref 4.22–5.81)
RDW: 14.4 % (ref 11.5–15.5)
WBC: 15.1 10*3/uL — ABNORMAL HIGH (ref 4.0–10.5)
nRBC: 0 % (ref 0.0–0.2)

## 2023-06-06 LAB — URINE CULTURE: Culture: >=100000 — AB

## 2023-06-06 LAB — CULTURE, BLOOD (ROUTINE X 2): Special Requests: ADEQUATE

## 2023-06-06 MED ORDER — CARMEX CLASSIC LIP BALM EX OINT
1.0000 | TOPICAL_OINTMENT | CUTANEOUS | Status: DC | PRN
  Filled 2023-06-06: qty 10

## 2023-06-06 MED ORDER — ORAL CARE MOUTH RINSE: 15.0000 mL | OROMUCOSAL | Status: DC | PRN

## 2023-06-06 NOTE — Progress Notes (Signed)
  Progress Note   Patient: Jordan Frazier ZOX:[id number] DOB: Aug 14, 1961 DOA: 06/04/2023     1 DOS: the patient was seen and examined on 06/06/2023   Brief hospital course: 61yom PMH DM type 2 diet-controlled presented w/ chills and myalgias, difficulty urinating. Admitted for severe sepsis secondary to acute prostatitis.  Assessment and Plan: Severe sepsis (fever, leukocytosis, elevated lactate) secondary to Acute prostatitis WBC 21.5 on admission. CT suggested prostatitis. No previous history Afebrile 24 hours, VSS, WBC pending today;   UC >100k Pseudomonas. BC NGTD. Continue empiric Cipro, f/u sensitivities. Treatment at least 4 weeks   DM2 diet controlled A1c 6.5 CBG stable. Diet-controlled; SSI   Hypokalemia Resolved    Essential hypertension Remains stable, continue home benazepril/HCTZ     Hyperlipidemia-atorvastatin      Subjective:  Feels better but still weak, not back to normal No pain  Physical Exam: Vitals:   06/05/23 1500 06/05/23 1850 06/05/23 2253 06/06/23 0513  BP: 112/65 119/69 124/73 107/67  Pulse: 83 92 79 73  Resp:  18 18 18   Temp: 98.7 F (37.1 C) 99.5 F (37.5 C) 98.7 F (37.1 C) 98.4 F (36.9 C)  TempSrc: Oral Oral Oral Oral  SpO2: 98% 96% 97% 97%  Weight:      Height:       Physical Exam Vitals reviewed.  Constitutional:      General: He is not in acute distress.    Appearance: He is not ill-appearing or toxic-appearing.  Cardiovascular:     Rate and Rhythm: Normal rate and regular rhythm.     Heart sounds: No murmur heard. Pulmonary:     Effort: Pulmonary effort is normal. No respiratory distress.     Breath sounds: No wheezing, rhonchi or rales.  Neurological:     Mental Status: He is alert.  Psychiatric:        Mood and Affect: Mood normal.        Behavior: Behavior normal.     Data Reviewed: CBC pending  Family Communication: none  Disposition: Status is: Inpatient Remains inpatient appropriate because: acute  prostatitis     Time spent: 25 minutes  Author: Brendia Sacks, MD 06/06/2023 7:39 AM  For on call review [email or web address].

## 2023-06-06 NOTE — Progress Notes (Signed)
Transition of Care [facility]) - Inpatient Brief Assessment   Patient Details  Name: Jordan Frazier MRN: [id number] Date of Birth: 28-May-1961  Transition of Care [facility]) CM/SW Contact:    Adrian Prows, RN Phone Number: 06/06/2023, 3:31 PM   Clinical Narrative: Pt identified POC sister [name] [phone]); he also verified PCP is at [facility], [place]; Brief Assessment completed.   Transition of Care Asessment: Insurance and Status: Insurance coverage has been reviewed Patient has primary care physician: Yes Home environment has been reviewed: yes Prior level of function:: independent Prior/Current Home Services: No current home services Social Determinants of Health Reivew: SDOH reviewed no interventions necessary Readmission risk has been reviewed: Yes Transition of care needs: no transition of care needs at this time

## 2023-06-07 DIAGNOSIS — A419 Sepsis, unspecified organism: Secondary | ICD-10-CM | POA: Diagnosis not present

## 2023-06-07 DIAGNOSIS — E119 Type 2 diabetes mellitus without complications: Secondary | ICD-10-CM | POA: Diagnosis not present

## 2023-06-07 DIAGNOSIS — R6520 Severe sepsis without septic shock: Secondary | ICD-10-CM | POA: Diagnosis not present

## 2023-06-07 DIAGNOSIS — N410 Acute prostatitis: Secondary | ICD-10-CM | POA: Diagnosis not present

## 2023-06-07 LAB — CBC
HCT: 41.5 % (ref 39.0–52.0)
Hemoglobin: 13.8 g/dL (ref 13.0–17.0)
MCH: 28.8 pg (ref 26.0–34.0)
MCHC: 33.3 g/dL (ref 30.0–36.0)
MCV: 86.6 fL (ref 80.0–100.0)
Platelets: 194 10*3/uL (ref 150–400)
RBC: 4.79 MIL/uL (ref 4.22–5.81)
RDW: 14.3 % (ref 11.5–15.5)
WBC: 9.1 10*3/uL (ref 4.0–10.5)
nRBC: 0 % (ref 0.0–0.2)

## 2023-06-07 LAB — GLUCOSE, CAPILLARY
Glucose-Capillary: 142 mg/dL — ABNORMAL HIGH (ref 70–99)
Glucose-Capillary: 156 mg/dL — ABNORMAL HIGH (ref 70–99)

## 2023-06-07 LAB — URINE CULTURE

## 2023-06-07 MED ORDER — CIPROFLOXACIN HCL 500 MG PO TABS: 500.0000 mg | ORAL_TABLET | Freq: Two times a day (BID) | ORAL | 0 refills | Status: AC

## 2023-06-07 MED ORDER — VALACYCLOVIR HCL 500 MG PO TABS
500.0000 mg | ORAL_TABLET | Freq: Once | ORAL | Status: AC
  Administered 2023-06-07: 500 mg via ORAL
  Filled 2023-06-07: qty 1

## 2023-06-07 MED ORDER — CIPROFLOXACIN HCL 500 MG PO TABS: 500.0000 mg | ORAL_TABLET | Freq: Two times a day (BID) | ORAL | 0 refills | Status: DC

## 2023-06-07 NOTE — Discharge Summary (Addendum)
Physician Discharge Summary   Patient: Jordan Frazier MRN: [id number] DOB: 1961-07-13  Admit date:     06/04/2023  Discharge date: 06/07/23  Discharge Physician: Brendia Sacks   PCP: Cheron Schaumann., MD   Recommendations at discharge:   Resolution of acute bacterial prostatitis, etiology unclear. Discharged on 4 weeks of abx, assess for cure or need to extend treatment.  Discharge Diagnoses: Principal Problem:   Severe sepsis (HCC) Active Problems:   Prostatitis, acute   DM type 2 (diabetes mellitus, type 2) (HCC)   Sepsis (HCC)   Pseudomonas and Serratia UTI  Resolved Problems:   * No resolved hospital problems. *  Hospital Course: 61yom PMH DM type 2 diet-controlled presented w/ chills and myalgias, difficulty urinating. Admitted for severe sepsis secondary to acute prostatitis.  Severe sepsis (fever, leukocytosis, elevated lactate) secondary to Acute prostatitis secondary to  Pseudomonas and Serratia UTI WBC 21.5 on admission. CT suggested prostatitis. No previous history Afebrile >24 hours, VSS, WBC 27.6 > 15.1 > 9.1 UC >100k Pseudomonas, sensitive to Cipro. BC NGTD.  Continue Cipro. Treatment at least 4 weeks. Follow-up as an outpatient with urology or PCP. Inciting event unclear, denies sexual activity. HIV nonreactive.    DM2 diet controlled A1c 6.5 CBG remains stable. Diet-controlled; SSI   Hypokalemia Resolved    Essential hypertension Remains stable, continue home benazepril/HCTZ     Hyperlipidemia-atorvastatin       Consultants:  None  Procedures performed:  None   Disposition: Home Diet recommendation:  Discharge Diet Orders (From admission, onward)     Start     Ordered   06/07/23 0000  Diet general        06/07/23 1052           Carb modified diet DISCHARGE MEDICATION: Allergies as of 06/07/2023       Reactions   Morphine Nausea Only, Other (See Comments)   This caused extreme nausea when delivered via IV. The patient said he  wants to be told BEFOREHAND if it is ever given again. He said he did NOT tolerate it well.   Niacin Itching        Medication List     TAKE these medications    Advil 200 MG tablet Generic drug: ibuprofen Take 400 mg by mouth every 6 (six) hours as needed for mild pain or headache.   atorvastatin 20 MG tablet Commonly known as: LIPITOR Take 20 mg by mouth at bedtime.   benazepril-hydrochlorthiazide 20-12.5 MG tablet Commonly known as: LOTENSIN HCT Take 1 tablet by mouth in the morning and at bedtime.   ciprofloxacin 500 MG tablet Commonly known as: Cipro Take 1 tablet (500 mg total) by mouth 2 (two) times daily for 28 days.   FISH OIL PO Take 1 capsule by mouth daily.   meloxicam 15 MG tablet Commonly known as: MOBIC Take 15 mg by mouth daily.   montelukast 10 MG tablet Commonly known as: SINGULAIR Take 10 mg by mouth at bedtime.   tadalafil 10 MG tablet Commonly known as: CIALIS Take 10 mg by mouth daily as needed for erectile dysfunction (30 minutes prior to activity).   Vitamin D3 25 MCG (1000 UT) Caps Take 1,000 Units by mouth daily.        Follow-up Information     Velazquez, Vira Browns., MD. Schedule an appointment as soon as possible for a visit in 2 week(s).   Specialty: Internal Medicine Contact information: [address] [place] [address] [phone]  [facility]. Schedule an appointment as soon as possible for a visit in 3 week(s).   Contact information: [address] [place] [address] [phone]               Feels better  Discharge Exam: Filed Weights   06/04/23 0950 06/04/23 1000  Weight: 109.8 kg 107 kg   Physical Exam Vitals reviewed.  Constitutional:      General: He is not in acute distress.    Appearance: He is not ill-appearing or toxic-appearing.     Comments: Appears better today.  Cardiovascular:     Rate and Rhythm: Normal rate and regular rhythm.      Heart sounds: No murmur heard. Pulmonary:     Effort: Pulmonary effort is normal. No respiratory distress.     Breath sounds: No wheezing, rhonchi or rales.  Musculoskeletal:     Right lower leg: No edema.     Left lower leg: No edema.  Neurological:     Mental Status: He is alert.  Psychiatric:        Mood and Affect: Mood normal.        Behavior: Behavior normal.      AFVSS CBG stable WBC 27.6 > 15.1 > 9.1 UC Pseudomonas sensitive to Cipro  Condition at discharge: good  The results of significant diagnostics from this hospitalization (including imaging, microbiology, ancillary and laboratory) are listed below for reference.   Imaging Studies: CT ABDOMEN PELVIS W CONTRAST  Result Date: 06/04/2023 CLINICAL DATA:  LLQ abdominal pain EXAM: CT ABDOMEN AND PELVIS WITH CONTRAST TECHNIQUE: Multidetector CT imaging of the abdomen and pelvis was performed using the standard protocol following bolus administration of intravenous contrast. RADIATION DOSE REDUCTION: This exam was performed according to the departmental dose-optimization program which includes automated exposure control, adjustment of the mA and/or kV according to patient size and/or use of iterative reconstruction technique. CONTRAST:  OMNIPAQUE IOHEXOL 300 MG/ML  SOLN COMPARISON:  None Available. FINDINGS: Lower chest: No acute abnormality. Hepatobiliary: No focal liver abnormality is seen. No gallstones, gallbladder wall thickening, or biliary dilatation. Pancreas: No evidence of peripancreatic fat stranding to suggest pancreatitis. No evidence of pancreatic ductal dilatation. Spleen: Normal in size without focal abnormality. Adrenals/Urinary Tract: Bilateral adrenal glands are normal in appearance. Bilateral kidneys enhance homogeneously and symmetrically. The urinary bladder is decompressed with circumferential bladder wall thickening. The prostate appears enlarged with mild hyperenhancement and surrounding inflammatory  fat stranding. Stomach/Bowel: Stomach is within normal limits. Appendix appears normal. No evidence of bowel wall thickening, distention, or inflammatory changes. Vascular/Lymphatic: No significant vascular findings are present. No enlarged abdominal or pelvic lymph nodes. Reproductive: Prostate appears enlarged with heterogeneous contrast enhancement and surrounding inflammatory fat stranding Other: No abdominal wall hernia or abnormality. No abdominopelvic ascites. Musculoskeletal: No acute or significant osseous findings. IMPRESSION: Inflammatory fat stranding surrounding the urinary bladder and prostate with circumferential bladder wall thickening and heterogenous contrast enhancement in the prostate. Findings could be seen in the setting of cystitis and/or prostatitis. Correlate with urinalysis and PSA. Electronically Signed   By: Lorenza Cambridge M.D.   On: 06/04/2023 14:45    Microbiology: Results for orders placed or performed during the hospital encounter of 06/04/23  Urine Culture (for pregnant, neutropenic or urologic patients or patients with an indwelling urinary catheter)     Status: Abnormal   Collection Time: 06/04/23  3:02 PM   Specimen: Urine, Clean Catch  Result Value Ref Range Status  Specimen Description   Final    URINE, CLEAN CATCH Performed at [facility], [address] [place], [place] [address]    Special Requests   Final    NONE Performed at [facility], [address], [place], [place] [address]    Culture (A)  Final    >=100,000 COLONIES/mL PSEUDOMONAS AERUGINOSA 60,000 COLONIES/mL SERRATIA MARCESCENS    Report Status 06/07/2023 FINAL  Final   Organism ID, Bacteria PSEUDOMONAS AERUGINOSA (A)  Final   Organism ID, Bacteria SERRATIA MARCESCENS (A)  Final      Susceptibility   Pseudomonas aeruginosa - MIC*    CEFTAZIDIME <=1 SENSITIVE Sensitive     CIPROFLOXACIN <=0.25 SENSITIVE Sensitive     GENTAMICIN 2 SENSITIVE Sensitive      IMIPENEM 2 SENSITIVE Sensitive     PIP/TAZO 8 SENSITIVE Sensitive     CEFEPIME 2 SENSITIVE Sensitive     * >=100,000 COLONIES/mL PSEUDOMONAS AERUGINOSA   Serratia marcescens - MIC*    CEFEPIME <=0.12 SENSITIVE Sensitive     CEFTRIAXONE <=0.25 SENSITIVE Sensitive     CIPROFLOXACIN <=0.25 SENSITIVE Sensitive     GENTAMICIN <=1 SENSITIVE Sensitive     NITROFURANTOIN 256 RESISTANT Resistant     TRIMETH/SULFA <=20 SENSITIVE Sensitive     * 60,000 COLONIES/mL SERRATIA MARCESCENS  Culture, blood (routine x 2)     Status: None (Preliminary result)   Collection Time: 06/04/23  4:41 PM   Specimen: BLOOD LEFT ARM  Result Value Ref Range Status   Specimen Description   Final    BLOOD LEFT ARM Performed at [facility], [address], [place], [place] [address]    Special Requests   Final    BOTTLES DRAWN AEROBIC AND ANAEROBIC Blood Culture adequate volume Performed at [facility], [address], [place], [place] [address]    Culture   Final    NO GROWTH 3 DAYS Performed at [facility], [address], [place], [place] [address]    Report Status PENDING  Incomplete  Culture, blood (routine x 2)     Status: None (Preliminary result)   Collection Time: 06/04/23  4:56 PM   Specimen: BLOOD  Result Value Ref Range Status   Specimen Description   Final    BLOOD BLOOD LEFT HAND Performed at [facility], [address], [place], [place] [address]    Special Requests   Final    BOTTLES DRAWN AEROBIC AND ANAEROBIC Blood Culture adequate volume Performed at [facility], [address] [place], [place] [address]    Culture   Final    NO GROWTH 3 DAYS Performed at [facility], [address], [place], [place] [address]    Report Status PENDING  Incomplete    Labs: CBC: Recent Labs  Lab 06/04/23 1101 06/05/23 0031 06/06/23 0943 06/07/23 0509  WBC 21.5* 27.6* 15.1* 9.1  NEUTROABS 19.3*  --   --   --    HGB 15.2 14.3 13.3 13.8  HCT 45.1 44.4 39.9 41.5  MCV 86.1 88.6 86.9 86.6  PLT 228 203 164 194   Basic Metabolic Panel: Recent Labs  Lab 06/04/23 1101 06/04/23 1644 06/05/23 0031  NA 134*  --  133*  K 3.0*  --  3.5  CL 102  --  103  CO2 23  --  20*  GLUCOSE 145*  --  155*  BUN 19  --  24*  CREATININE 0.97  --  1.04  CALCIUM 8.8*  --  8.2*  MG  --  1.6*  --    Liver Function Tests: Recent Labs  Lab 06/04/23 1101  AST 19  ALT 29  ALKPHOS 52  BILITOT 1.7*  PROT 7.3  ALBUMIN 3.9   CBG: Recent Labs  Lab 06/06/23 0804 06/06/23 1141 06/06/23 1649 06/06/23 1948 06/07/23 0730  GLUCAP 130* 153* 160* 139* 142*    Discharge time spent: greater than 30 minutes.  Signed: Brendia Sacks, MD [facility] 06/07/2023

## 2023-06-07 NOTE — Plan of Care (Signed)

## 2023-06-08 LAB — CULTURE, BLOOD (ROUTINE X 2): Culture: NO GROWTH

## 2023-06-09 LAB — CULTURE, BLOOD (ROUTINE X 2): Special Requests: ADEQUATE
